# Patient Record
Sex: Male | Born: 1945 | Race: White | Hispanic: No | Marital: Married | Smoking: Never smoker
Health system: Southern US, Community
[De-identification: ages and names within clinical notes are randomized; demographics above are authoritative.]

## PROBLEM LIST (undated history)

## (undated) DIAGNOSIS — E291 Testicular hypofunction: Secondary | ICD-10-CM

## (undated) DIAGNOSIS — N400 Enlarged prostate without lower urinary tract symptoms: Secondary | ICD-10-CM

## (undated) DIAGNOSIS — N401 Enlarged prostate with lower urinary tract symptoms: Secondary | ICD-10-CM

## (undated) DIAGNOSIS — I10 Essential (primary) hypertension: Secondary | ICD-10-CM

## (undated) DIAGNOSIS — E785 Hyperlipidemia, unspecified: Secondary | ICD-10-CM

## (undated) DIAGNOSIS — I499 Cardiac arrhythmia, unspecified: Secondary | ICD-10-CM

## (undated) DIAGNOSIS — G2581 Restless legs syndrome: Secondary | ICD-10-CM

## (undated) DIAGNOSIS — D473 Essential (hemorrhagic) thrombocythemia: Secondary | ICD-10-CM

## (undated) DIAGNOSIS — I639 Cerebral infarction, unspecified: Secondary | ICD-10-CM

## (undated) DIAGNOSIS — K219 Gastro-esophageal reflux disease without esophagitis: Secondary | ICD-10-CM

## (undated) DIAGNOSIS — F329 Major depressive disorder, single episode, unspecified: Secondary | ICD-10-CM

## (undated) DIAGNOSIS — Z974 Presence of external hearing-aid: Secondary | ICD-10-CM

## (undated) DIAGNOSIS — F32A Depression, unspecified: Secondary | ICD-10-CM

## (undated) DIAGNOSIS — H9190 Unspecified hearing loss, unspecified ear: Secondary | ICD-10-CM

## (undated) DIAGNOSIS — H269 Unspecified cataract: Secondary | ICD-10-CM

## (undated) DIAGNOSIS — I82409 Acute embolism and thrombosis of unspecified deep veins of unspecified lower extremity: Secondary | ICD-10-CM

## (undated) DIAGNOSIS — F419 Anxiety disorder, unspecified: Secondary | ICD-10-CM

---
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
## 2003-02-04 DIAGNOSIS — I82409 Acute embolism and thrombosis of unspecified deep veins of unspecified lower extremity: Secondary | ICD-10-CM

## 2003-02-04 HISTORY — DX: Acute embolism and thrombosis of unspecified deep veins of unspecified lower extremity: I82.409

## 2007-11-17 ENCOUNTER — Emergency Department (HOSPITAL_BASED_OUTPATIENT_CLINIC_OR_DEPARTMENT_OTHER): Admission: EM | Admit: 2007-11-17 | Discharge: 2007-11-17 | Payer: Self-pay | Admitting: Emergency Medicine

## 2009-10-11 IMAGING — CR DG FINGER MIDDLE 2+V*L*
3 series · 3 of 3 positions shown · non-contrast
Comparison: None

CLINICAL DATA: The cut middle finger

LEFT MIDDLE FINGER 2+V

[x finger pa left]
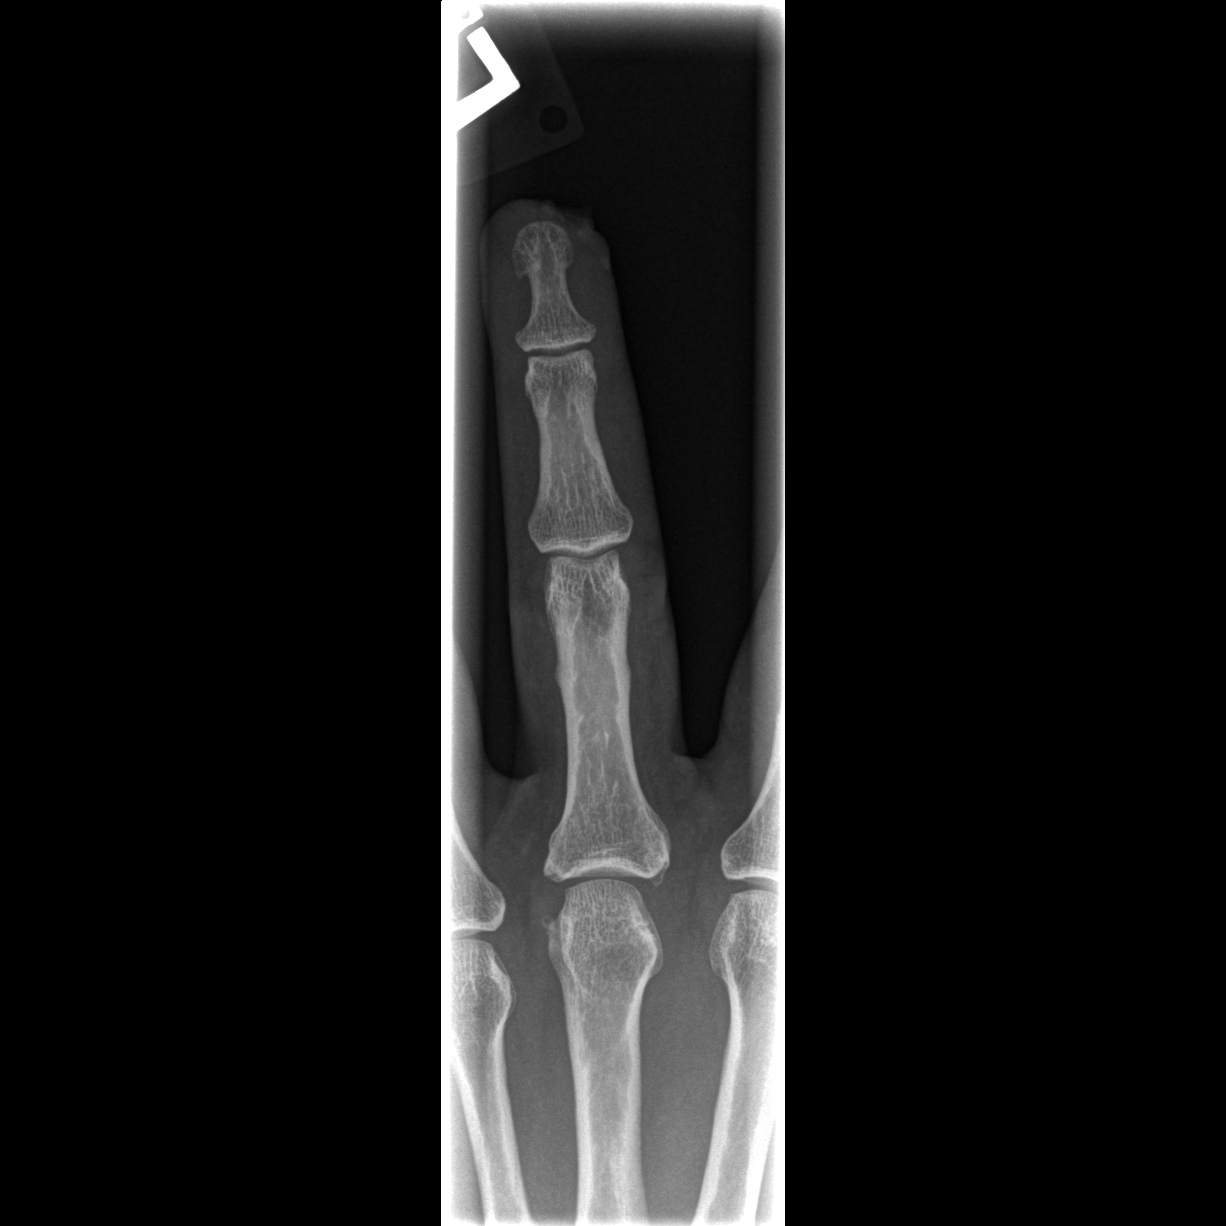

[x finger obl. left]
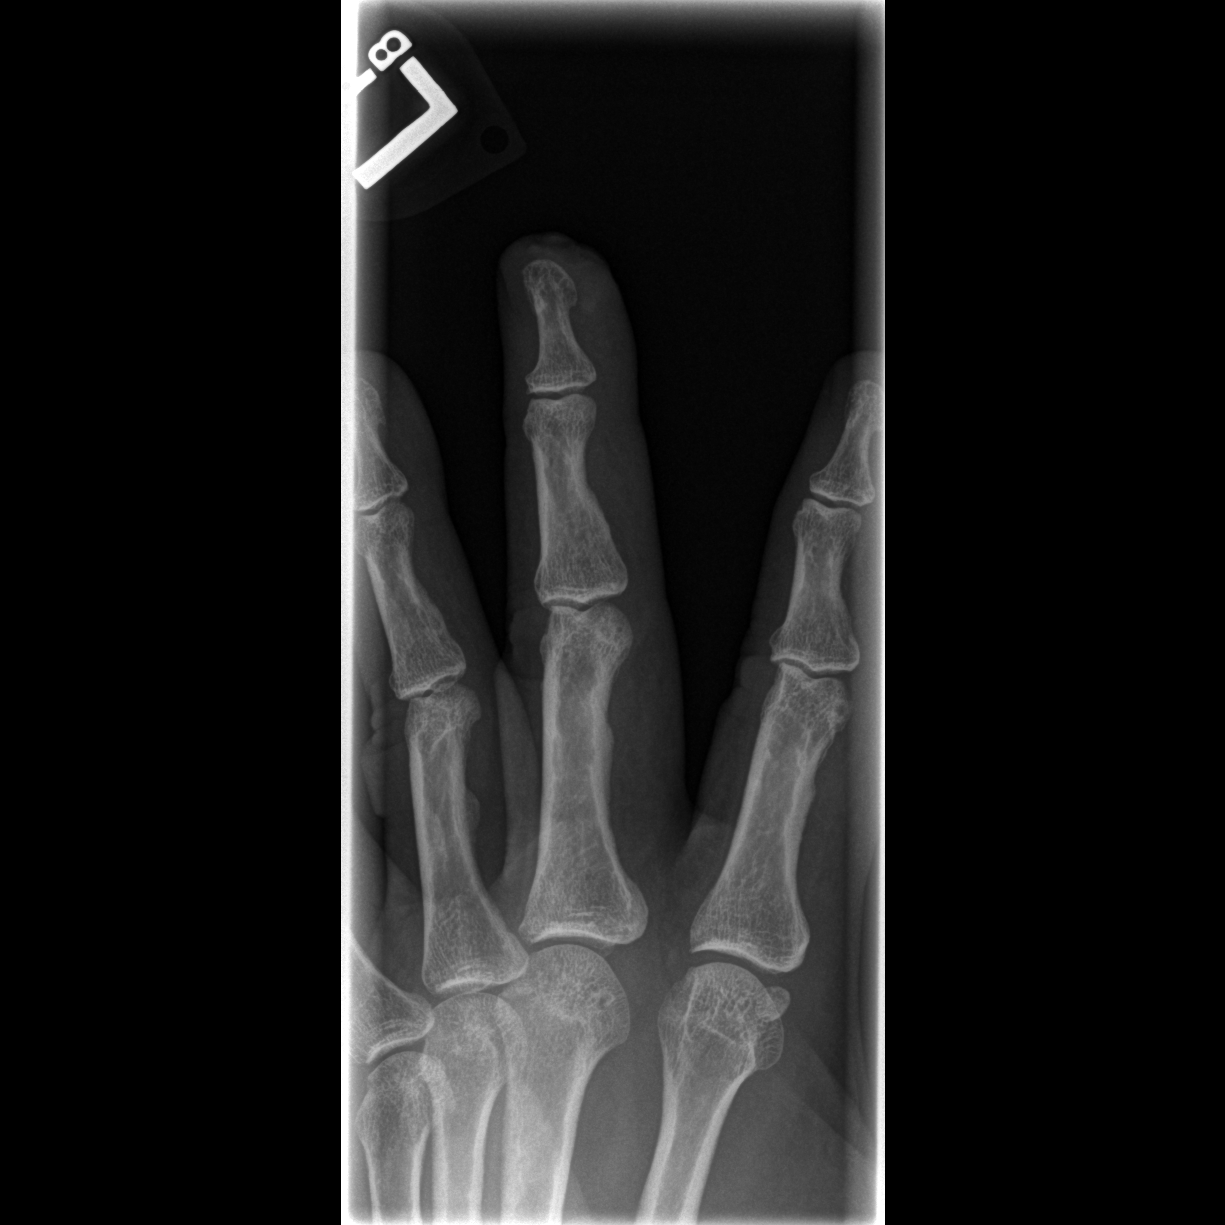

[x finger lateral left]
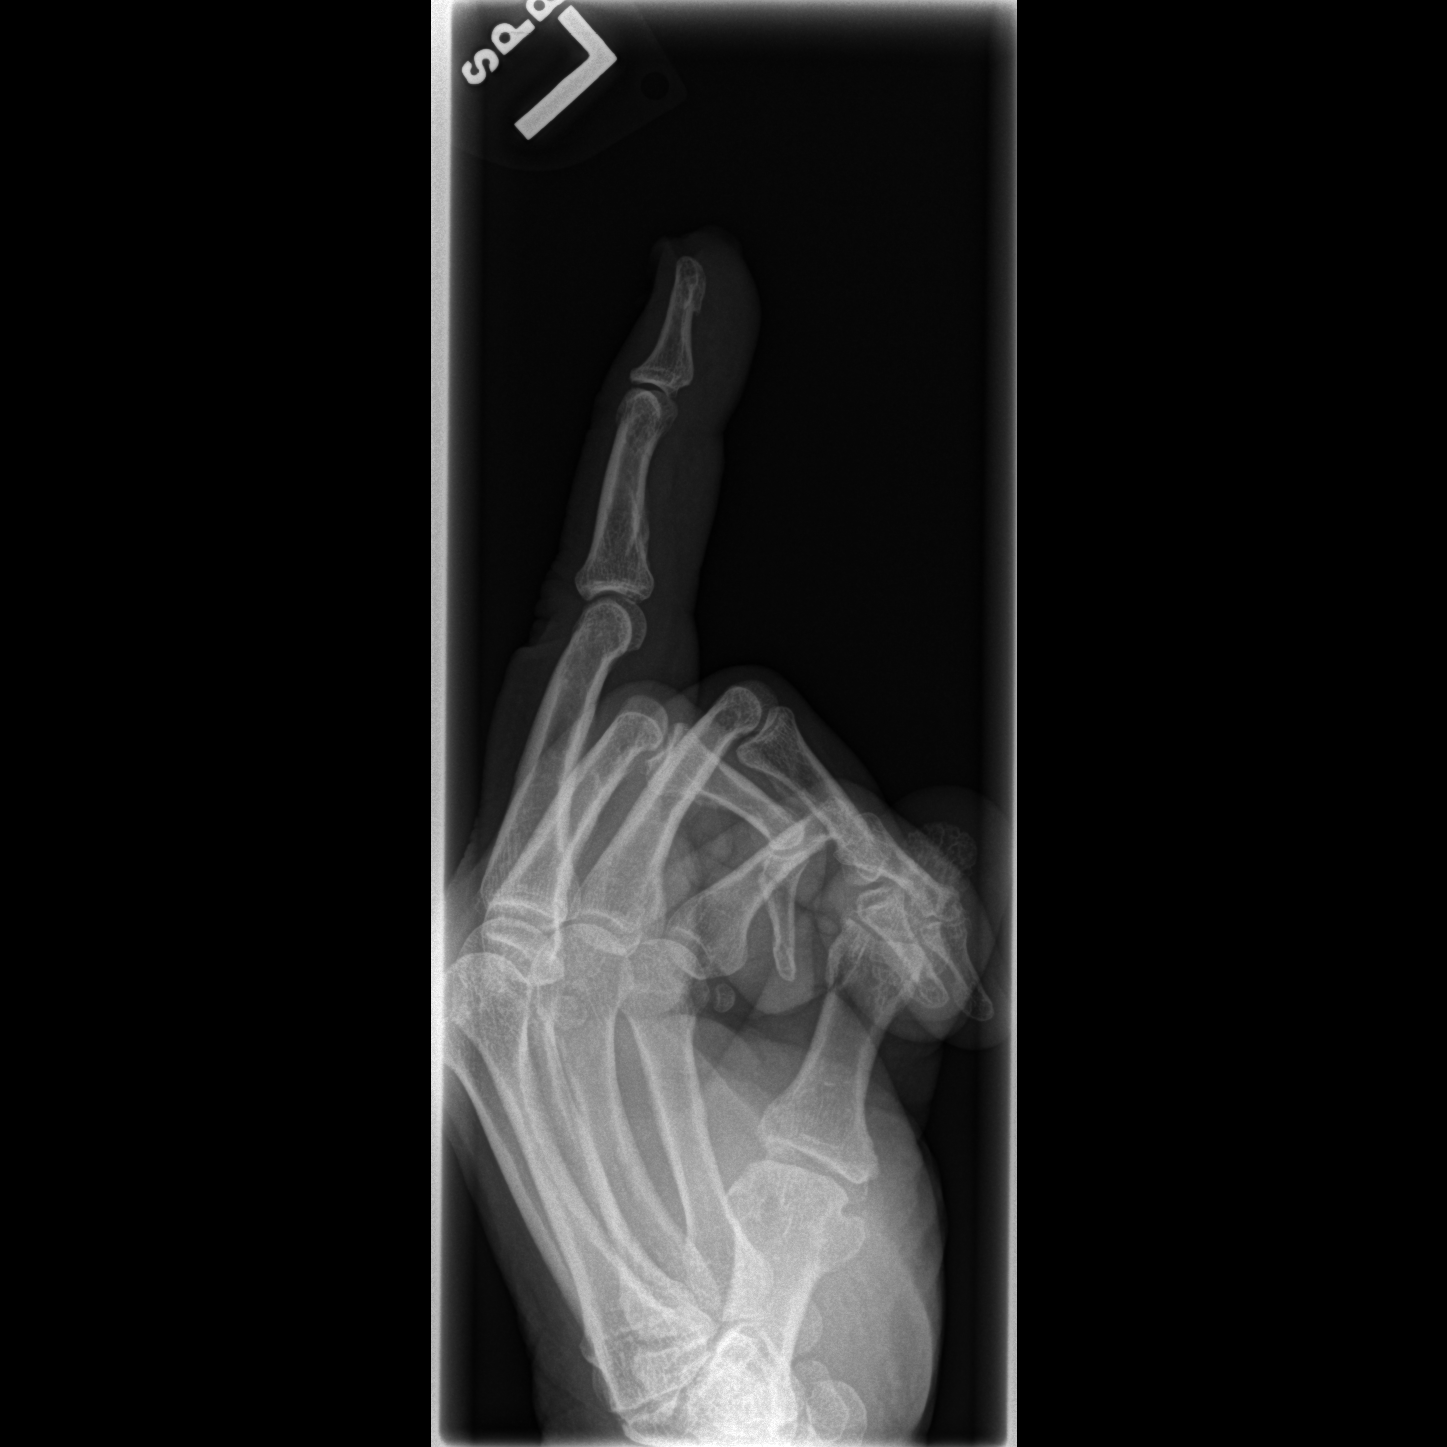

[3 of 3 positions shown; findings below may reference images not displayed]

FINDINGS: There is a laceration involving the soft tissues
overlying the tuft of the distal phalanx.

There is no underlying radiopaque foreign bodies.

The osseous structures are intact without fracture or dislocation.
IMPRESSION: 1.  Soft tissue laceration without underlying osseous abnormality.

## 2017-02-03 DIAGNOSIS — D75839 Thrombocytosis, unspecified: Secondary | ICD-10-CM

## 2017-02-03 HISTORY — DX: Thrombocytosis, unspecified: D75.839

## 2017-09-28 DIAGNOSIS — I639 Cerebral infarction, unspecified: Secondary | ICD-10-CM

## 2017-09-28 HISTORY — DX: Cerebral infarction, unspecified: I63.9

## 2018-07-05 ENCOUNTER — Encounter: Payer: Self-pay | Admitting: Student

## 2018-07-05 NOTE — H&P (Signed)
TOTAL HIP ADMISSION H&P  Patient is admitted for right total hip arthroplasty.  Subjective:  Chief Complaint: right hip pain  HPI: Bradley Ayers, 73 y.o. male, has a history of pain and functional disability in the right hip(s) due to arthritis and patient has failed non-surgical conservative treatments for greater than 12 weeks to include use of assistive devices and activity modification.  Onset of symptoms was gradual starting 4 years ago with gradually worsening course since that time.The patient noted no past surgery on the right hip(s).  Patient currently rates pain in the right hip at 6 out of 10 with activity. Patient has worsening of pain with activity and weight bearing, pain that interfers with activities of daily living and instability. 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 This condition presents safety issues increasing the risk of falls. There is no current active infection.  There are no active problems to display for this patient.  History reviewed. No pertinent past medical history.  History reviewed. No pertinent surgical history.  No current facility-administered medications for this encounter.    No current outpatient medications on file.   Allergies not on file  Social History   Tobacco Use   Smoking status: Not on file  Substance Use Topics   Alcohol use: Not on file    History reviewed. No pertinent family history.   Review of Systems  Constitutional: Negative for chills and fever.  HENT: Negative for congestion, sore throat and tinnitus.   Eyes: Negative for double vision, photophobia and pain.  Respiratory: Negative for cough, shortness of breath and wheezing.   Cardiovascular: Negative for chest pain, palpitations and orthopnea.  Gastrointestinal: Negative for heartburn, nausea and vomiting.  Genitourinary:  Negative for dysuria, frequency and urgency.  Musculoskeletal: Positive for joint pain.  Neurological: Negative for dizziness, weakness and headaches.    Objective:  Physical Exam  Well nourished and well developed.  General: Alert and oriented x3, cooperative and pleasant, no acute distress.  Head: normocephalic, atraumatic, neck supple.  Eyes: EOMI.  Respiratory: breath sounds clear in all fields, no wheezing, rales, or rhonchi. Cardiovascular: Regular rate and rhythm, no murmurs, gallops or rubs.  Abdomen: non-tender to palpation and soft, normoactive bowel sounds. Musculoskeletal:  Right Hip Exam: ROM: Flexion to 90 degrees. 30 degree external rotation contracture, no internal or external rotation beyond that was possible. Abduction 10 degrees.  There is no tenderness over the greater trochanter bursa.   Calves soft and nontender. Motor function intact in LE. Strength 5/5 LE bilaterally. Neuro: Distal pulses 2+. Sensation to light touch intact in LE.  Vital signs in last 24 hours: Blood pressure: 130/72 mmHg Pulse: 76 bpm  Labs:   There is no height or weight on file to calculate BMI.   Imaging Review Plain radiographs demonstrate severe degenerative joint disease of the right hip(s). The bone quality appears to be adequate for age and reported activity level.      Assessment/Plan:  End stage arthritis, right hip(s)  The patient history, physical examination, clinical judgement of the provider and imaging studies are consistent with end stage degenerative joint disease of the right hip(s) and total hip arthroplasty is deemed medically necessary. The treatment options including medical management, injection therapy, arthroscopy and arthroplasty were discussed at length. The risks and benefits of total hip arthroplasty were presented and reviewed. The risks due to aseptic loosening,  infection, stiffness, dislocation/subluxation,  thromboembolic complications and other  imponderables were discussed.  The patient acknowledged the explanation, agreed to proceed with the plan and consent was signed. Patient is being admitted for inpatient treatment for surgery, pain control, PT, OT, prophylactic antibiotics, VTE prophylaxis, progressive ambulation and ADL's and discharge planning.The patient is planning to be discharged home.  Anticipated LOS equal to or greater than 2 midnights due to - Age 32 and older with one or more of the following:  - Obesity  - Expected need for hospital services (PT, OT, Nursing) required for safe  discharge  - Anticipated need for postoperative skilled nursing care or inpatient rehab  - Active co-morbidities: Stroke and DVT/VTE OR   - Unanticipated findings during/Post Surgery: None  - Patient is a high risk of re-admission due to: None   Therapy Plans: HEP Disposition: Home with wife Planned DVT Prophylaxis: Plavix and aspirin (hx of stroke) DME needed: Gilford Rile, 3-in-1 PCP: Dr. Jeralene Huff Hematologist: Dr. Stacie Glaze TXA: Topical (hx DVT/PE) Allergies: NKDA Anesthesia Concerns: None BMI: 31.4  - Patient was instructed on what medications to stop prior to surgery. - Follow-up visit in 2 weeks with Dr. Wynelle Link - Begin physical therapy following surgery - Pre-operative lab work as pre-surgical testing - Prescriptions will be provided in hospital at time of discharge  Theresa Duty, PA-C Orthopedic Surgery [facility] [place]

## 2018-07-20 NOTE — Patient Instructions (Addendum)
YOU ARE REQUIRED TO BE TESTED FOR COVID-19 PRIOR TO YOUR SURGERY . YOUR TEST MUST BE COMPLETED ON Thursday June 18th  . TESTING IS LOCATED AT [place] ENTRANCE FROM 9:00AM - 3:00PM. FAILURE TO COMPLETE TESTING MAY RESULT IN CANCELLATION OF YOUR SURGERY.  ONCE YOUR COVID TEST IS COMPLETED, PLEASE BEGIN THE QUARANTINE INSTRUCTIONS AS OUTLINED IN YOUR HANDOUT.                 Aarin Stansel     Your procedure is scheduled on: 07-26-2018    Report to [facility] Main  Entrance    Report to admitting at 1210 PM      Call this number if you have problems the morning of surgery [phone]    Remember:  [place], NO [place].   NO SOLID FOOD AFTER MIDNIGHT THE NIGHT PRIOR TO SURGERY. YOU MAY ONLY DRINK CLEAR LIQUIDS UNTIL 830 AM.  PLEASE   FINISH ENSURE DRINK PER SURGEON ORDER WHICH NEEDS TO BE COMPLETED AT 430 AM.    CLEAR LIQUID DIET   Foods Allowed                                                                     Foods Excluded  Coffee and tea, regular and decaf                             liquids that you cannot  Plain Jell-O in any flavor                                             see through such as: Fruit ices (not with fruit pulp)                                     milk, soups, orange juice  Iced Popsicles                                    All solid food Carbonated beverages, regular and diet                                    Cranberry, grape and apple juices Sports drinks like Gatorade Lightly seasoned clear broth or consume(fat free) Sugar, honey syrup  Sample Menu Breakfast                                Lunch                                     Supper Cranberry juice  Beef broth                            Chicken broth Jell-O                                     Grape juice                           Apple juice Coffee or tea                         Jell-O                                      Popsicle                                                Coffee or tea                        Coffee or tea  _____________________________________________________________________    Take these medicines the morning of surgery with A SIP OF WATER: DULOXETINE (CYMBALTA), ESCITALOPRAM (LEXAPRO), SIMVASTATIN (LIPITOR), FINASTERIDE (PROSCAR), ALFUZOSIN (UROXATRAL), PANTAPRAZOLE (PROTONIX)                                You may not have any metal on your body including hair pins and              piercings  Do not wear jewelry, make-up, lotions, powders or perfumes, deodorant              Men may shave face and neck.   Do not bring valuables to the hospital. [place].  Contacts, dentures or bridgework may not be worn into surgery.  Leave suitcase in the car. After surgery it may be brought to your room.      _____________________________________________________________________             [facility] - Preparing for Surgery Before surgery, you can play an important role.  Because skin is not sterile, your skin needs to be as free of germs as possible.  You can reduce the number of germs on your skin by washing with CHG (chlorahexidine gluconate) soap before surgery.  CHG is an antiseptic cleaner which kills germs and bonds with the skin to continue killing germs even after washing. Please DO NOT use if you have an allergy to CHG or antibacterial soaps.  If your skin becomes reddened/irritated stop using the CHG and inform your nurse when you arrive at Short Stay. Do not shave (including legs and underarms) for at least 48 hours prior to the first CHG shower.  You may shave your face/neck. Please follow these instructions carefully:  1.  Shower with CHG Soap the night before surgery and the  morning of Surgery.  2.  If you choose to wash your hair, wash your hair first as usual with  your   normal  shampoo.  3.  After you shampoo, rinse your hair and body thoroughly to remove the  shampoo.                           4.  Use CHG as you would any other liquid soap.  You can apply chg directly  to the skin and wash                       Gently with a scrungie or clean washcloth.  5.  Apply the CHG Soap to your body ONLY FROM THE NECK DOWN.   Do not use on face/ open                           Wound or open sores. Avoid contact with eyes, ears mouth and genitals (private parts).                       Wash face,  Genitals (private parts) with your normal soap.             6.  Wash thoroughly, paying special attention to the area where your surgery  will be performed.  7.  Thoroughly rinse your body with warm water from the neck down.  8.  DO NOT shower/wash with your normal soap after using and rinsing off  the CHG Soap.                9.  Pat yourself dry with a clean towel.            10.  Wear clean pajamas.            11.  Place clean sheets on your bed the night of your first shower and do not  sleep with pets. Day of Surgery : Do not apply any lotions/deodorants the morning of surgery.  Please wear clean clothes to the hospital/surgery center.  FAILURE TO FOLLOW THESE INSTRUCTIONS MAY RESULT IN THE CANCELLATION OF YOUR SURGERY PATIENT SIGNATURE_________________________________  NURSE SIGNATURE__________________________________  ________________________________________________________________________   [name]  An incentive spirometer is a tool that can help keep your lungs clear and active. This tool measures how well you are filling your lungs with each breath. Taking long deep breaths may help reverse or decrease the chance of developing breathing (pulmonary) problems (especially infection) following:  A long period of time when you are unable to move or be active. BEFORE THE PROCEDURE   If the spirometer includes an indicator to show your best effort, your  nurse or respiratory therapist will set it to a desired goal.  If possible, sit up straight or lean slightly forward. Try not to slouch.  Hold the incentive spirometer in an upright position. INSTRUCTIONS FOR USE  1. Sit on the edge of your bed if possible, or sit up as far as you can in bed or on a chair. 2. Hold the incentive spirometer in an upright position. 3. Breathe out normally. 4. Place the mouthpiece in your mouth and seal your lips tightly around it. 5. Breathe in slowly and as deeply as possible, raising the piston or the ball toward the top of the column. 6. Hold your breath for 3-5 seconds or for as long as possible. Allow the piston or ball to fall to the bottom of the column. 7. Remove the mouthpiece from your  mouth and breathe out normally. 8. Rest for a few seconds and repeat Steps 1 through 7 at least 10 times every 1-2 hours when you are awake. Take your time and take a few normal breaths between deep breaths. 9. The spirometer may include an indicator to show your best effort. Use the indicator as a goal to work toward during each repetition. 10. After each set of 10 deep breaths, practice coughing to be sure your lungs are clear. If you have an incision (the cut made at the time of surgery), support your incision when coughing by placing a pillow or rolled up towels firmly against it. Once you are able to get out of bed, walk around indoors and cough well. You may stop using the incentive spirometer when instructed by your caregiver.  RISKS AND COMPLICATIONS  Take your time so you do not get dizzy or light-headed.  If you are in pain, you may need to take or ask for pain medication before doing incentive spirometry. It is harder to take a deep breath if you are having pain. AFTER USE  Rest and breathe slowly and easily.  It can be helpful to keep track of a log of your progress. Your caregiver can provide you with a simple table to help with this. If you are using the  spirometer at home, follow these instructions: [place] IF:   You are having difficultly using the spirometer.  You have trouble using the spirometer as often as instructed.  Your pain medication is not giving enough relief while using the spirometer.  You develop fever of 100.5 F (38.1 C) or higher. SEEK IMMEDIATE MEDICAL CARE IF:   You cough up bloody sputum that had not been present before.  You develop fever of 102 F (38.9 C) or greater.  You develop worsening pain at or near the incision site. MAKE SURE YOU:   Understand these instructions.  Will watch your condition.  Will get help right away if you are not doing well or get worse. Document Released: 06/02/2006 Document Revised: 04/14/2011 Document Reviewed: 08/03/2006 ExitCare Patient Information 2014 ExitCare, [place].   ________________________________________________________________________  WHAT IS A BLOOD TRANSFUSION? Blood Transfusion Information  A transfusion is the replacement of blood or some of its parts. Blood is made up of multiple cells which provide different functions.  Red blood cells carry oxygen and are used for blood loss replacement.  White blood cells fight against infection.  Platelets control bleeding.  Plasma helps clot blood.  Other blood products are available for specialized needs, such as hemophilia or other clotting disorders. BEFORE THE TRANSFUSION  Who gives blood for transfusions?   Healthy volunteers who are fully evaluated to make sure their blood is safe. This is blood bank blood. Transfusion therapy is the safest it has ever been in the practice of medicine. Before blood is taken from a donor, a complete history is taken to make sure that person has no history of diseases nor engages in risky social behavior (examples are intravenous drug use or sexual activity with multiple partners). The donor's travel history is screened to minimize risk of transmitting  infections, such as malaria. The donated blood is tested for signs of infectious diseases, such as HIV and hepatitis. The blood is then tested to be sure it is compatible with you in order to minimize the chance of a transfusion reaction. If you or a relative donates blood, this is often done in anticipation of surgery and is not  appropriate for emergency situations. It takes many days to process the donated blood. RISKS AND COMPLICATIONS Although transfusion therapy is very safe and saves many lives, the main dangers of transfusion include:   Getting an infectious disease.  Developing a transfusion reaction. This is an allergic reaction to something in the blood you were given. Every precaution is taken to prevent this. The decision to have a blood transfusion has been considered carefully by your caregiver before blood is given. Blood is not given unless the benefits outweigh the risks. AFTER THE TRANSFUSION  Right after receiving a blood transfusion, you will usually feel much better and more energetic. This is especially true if your red blood cells have gotten low (anemic). The transfusion raises the level of the red blood cells which carry oxygen, and this usually causes an energy increase.  The nurse administering the transfusion will monitor you carefully for complications. HOME CARE INSTRUCTIONS  No special instructions are needed after a transfusion. You may find your energy is better. Speak with your caregiver about any limitations on activity for underlying diseases you may have. SEEK MEDICAL CARE IF:   Your condition is not improving after your transfusion.  You develop redness or irritation at the intravenous (IV) site. SEEK IMMEDIATE MEDICAL CARE IF:  Any of the following symptoms occur over the next 12 hours:  Shaking chills.  You have a temperature by mouth above 102 F (38.9 C), not controlled by medicine.  Chest, back, or muscle pain.  People around you feel you are  not acting correctly or are confused.  Shortness of breath or difficulty breathing.  Dizziness and fainting.  You get a rash or develop hives.  You have a decrease in urine output.  Your urine turns a dark color or changes to pink, red, or brown. Any of the following symptoms occur over the next 10 days:  You have a temperature by mouth above 102 F (38.9 C), not controlled by medicine.  Shortness of breath.  Weakness after normal activity.  The white part of the eye turns yellow (jaundice).  You have a decrease in the amount of urine or are urinating less often.  Your urine turns a dark color or changes to pink, red, or brown. Document Released: 01/18/2000 Document Revised: 04/14/2011 Document Reviewed: 09/06/2007 [facility] Patient Information 2014 [place], [place].  _______________________________________________________________________

## 2018-07-21 ENCOUNTER — Encounter (HOSPITAL_COMMUNITY)
Admission: RE | Admit: 2018-07-21 | Discharge: 2018-07-21 | Disposition: A | Discharge status: Home / Self Care | Payer: Medicare Other | Source: Ambulatory Visit | Attending: Orthopedic Surgery | Admitting: Orthopedic Surgery

## 2018-07-21 ENCOUNTER — Encounter (HOSPITAL_COMMUNITY): Payer: Self-pay

## 2018-07-21 ENCOUNTER — Other Ambulatory Visit: Payer: Self-pay

## 2018-07-21 DIAGNOSIS — M1611 Unilateral primary osteoarthritis, right hip: Secondary | ICD-10-CM | POA: Diagnosis not present

## 2018-07-21 DIAGNOSIS — I10 Essential (primary) hypertension: Secondary | ICD-10-CM | POA: Diagnosis not present

## 2018-07-21 DIAGNOSIS — Z01818 Encounter for other preprocedural examination: Secondary | ICD-10-CM | POA: Insufficient documentation

## 2018-07-21 HISTORY — DX: Unspecified hearing loss, unspecified ear: H91.90

## 2018-07-21 HISTORY — DX: Depression, unspecified: F32.A

## 2018-07-21 HISTORY — DX: Benign prostatic hyperplasia with lower urinary tract symptoms: N40.1

## 2018-07-21 HISTORY — DX: Essential (primary) hypertension: I10

## 2018-07-21 HISTORY — DX: Cardiac arrhythmia, unspecified: I49.9

## 2018-07-21 HISTORY — DX: Gastro-esophageal reflux disease without esophagitis: K21.9

## 2018-07-21 HISTORY — DX: Restless legs syndrome: G25.81

## 2018-07-21 HISTORY — DX: Benign prostatic hyperplasia without lower urinary tract symptoms: N40.0

## 2018-07-21 HISTORY — DX: Unspecified cataract: H26.9

## 2018-07-21 HISTORY — DX: Testicular hypofunction: E29.1

## 2018-07-21 HISTORY — DX: Presence of external hearing-aid: Z97.4

## 2018-07-21 HISTORY — DX: Hyperlipidemia, unspecified: E78.5

## 2018-07-21 HISTORY — DX: Anxiety disorder, unspecified: F41.9

## 2018-07-21 LAB — ABO/RH: ABO/RH(D): A POS

## 2018-07-21 LAB — COMPREHENSIVE METABOLIC PANEL
ALT: 17 U/L (ref 0–44)
AST: 21 U/L (ref 15–41)
Albumin: 4.2 g/dL (ref 3.5–5.0)
Alkaline Phosphatase: 64 U/L (ref 38–126)
Anion gap: 8 (ref 5–15)
BUN: 18 mg/dL (ref 8–23)
CO2: 27 mmol/L (ref 22–32)
Calcium: 9 mg/dL (ref 8.9–10.3)
Chloride: 106 mmol/L (ref 98–111)
Creatinine, Ser: 0.81 mg/dL (ref 0.61–1.24)
GFR calc Af Amer: >60 mL/min (ref >60)
GFR calc non Af Amer: >60 mL/min (ref >60)
Glucose, Bld: 98 mg/dL (ref 70–99)
Potassium: 4.7 mmol/L (ref 3.5–5.1)
Sodium: 141 mmol/L (ref 135–145)
Total Bilirubin: 0.8 mg/dL (ref 0.3–1.2)
Total Protein: 7.2 g/dL (ref 6.5–8.1)

## 2018-07-21 LAB — CBC
HCT: 39.5 % (ref 39.0–52.0)
Hemoglobin: 13 g/dL (ref 13.0–17.0)
MCH: 39.2 pg — ABNORMAL HIGH (ref 26.0–34.0)
MCHC: 32.9 g/dL (ref 30.0–36.0)
MCV: 119 fL — ABNORMAL HIGH (ref 80.0–100.0)
Platelets: 402 10*3/uL — ABNORMAL HIGH (ref 150–400)
RBC: 3.32 MIL/uL — ABNORMAL LOW (ref 4.22–5.81)
RDW: 12.9 % (ref 11.5–15.5)
WBC: 5.8 10*3/uL (ref 4.0–10.5)
nRBC: 0 % (ref 0.0–0.2)

## 2018-07-21 LAB — PROTIME-INR
INR: 1 (ref 0.8–1.2)
Prothrombin Time: 13 seconds (ref 11.4–15.2)

## 2018-07-21 LAB — SURGICAL PCR SCREEN
MRSA, PCR: NEGATIVE
Staphylococcus aureus: NEGATIVE

## 2018-07-21 LAB — APTT: aPTT: 28 seconds (ref 24–36)

## 2018-07-21 NOTE — Progress Notes (Signed)
PRE-O NURSE PROGRESS NOTE   Patient with hx of CVA 09-28-17. pertinent hx of htn , hld, and thrombocythemia. Imaging done at [facility] at time of stroke  includes MRI head, CT angiography, ECHO (see in epic care everywhere). Patient reports residual impaired balance and loss of sensation in left side of face. Patient denies prior stroke nor  MI and denies recurring stroke sx since. Vitals today WDL and no complaint of acute cardiac sx. Patient managed by neurologist Dr Delman Kitten with [facility] and thrombocythemia is managed by Dr Cruzita Lederer (hematology oncology) with [facility]. Patient reports Dr Cruzita Lederer manages his PLAVIX prescription but he has not received any instructions on PLAVIX management for upcoming surgery. Also note that patient states his neurologist is not aware of his upcoming surgery .

## 2018-07-22 ENCOUNTER — Other Ambulatory Visit (HOSPITAL_COMMUNITY)
Admission: RE | Admit: 2018-07-22 | Discharge: 2018-07-22 | Disposition: A | Discharge status: Home / Self Care | Payer: Medicare Other | Source: Ambulatory Visit | Attending: Orthopedic Surgery | Admitting: Orthopedic Surgery

## 2018-07-22 DIAGNOSIS — Z01818 Encounter for other preprocedural examination: Secondary | ICD-10-CM | POA: Diagnosis not present

## 2018-07-22 LAB — SARS CORONAVIRUS 2 (TAT 6-24 HRS): SARS Coronavirus 2: NEGATIVE
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
## 2018-07-23 NOTE — Anesthesia Preprocedure Evaluation (Addendum)
Anesthesia Evaluation  Patient identified by MRN, date of birth, ID band Patient awake    Reviewed: Allergy & Precautions, NPO status , Patient's Chart, lab work & pertinent test results  Airway Mallampati: II  TM Distance: >3 FB Neck ROM: Full    Dental  (+) Chipped,    Pulmonary neg pulmonary ROS,    Pulmonary exam normal breath sounds clear to auscultation       Cardiovascular hypertension, Pt. on medications + DVT  Normal cardiovascular exam Rhythm:Regular Rate:Normal  ECG: NSR rate 66   Neuro/Psych PSYCHIATRIC DISORDERS Anxiety Depression RLS (restless legs syndrome) CVA, No Residual Symptoms    GI/Hepatic Neg liver ROS, GERD  Medicated and Controlled,  Endo/Other  negative endocrine ROS  Renal/GU negative Renal ROS     Musculoskeletal negative musculoskeletal ROS (+)   Abdominal (+) + obese,   Peds  Hematology HLD   Anesthesia Other Findings Right hip osteoarthritis  Reproductive/Obstetrics                          Anesthesia Physical Anesthesia Plan  ASA: III  Anesthesia Plan: Spinal   Post-op Pain Management:    Induction:   PONV Risk Score and Plan: 1 and Ondansetron, Dexamethasone and Treatment may vary due to age or medical condition  Airway Management Planned: Simple Face Mask  Additional Equipment:   Intra-op Plan:   Post-operative Plan:   Informed Consent: I have reviewed the patients History and Physical, chart, labs and discussed the procedure including the risks, benefits and alternatives for the proposed anesthesia with the patient or authorized representative who has indicated his/her understanding and acceptance.     Dental advisory given  Plan Discussed with: CRNA  Anesthesia Plan Comments: (Reviewed PAT note 07/21/2018, Konrad Felix, PA-C)      Anesthesia Quick Evaluation

## 2018-07-26 ENCOUNTER — Inpatient Hospital Stay (HOSPITAL_COMMUNITY): Payer: Medicare Other

## 2018-07-26 ENCOUNTER — Encounter (HOSPITAL_COMMUNITY)
Admission: RE | Disposition: A | Discharge status: Home / Self Care | Payer: Self-pay | Source: Home / Self Care | Attending: Orthopedic Surgery

## 2018-07-26 ENCOUNTER — Other Ambulatory Visit: Payer: Self-pay

## 2018-07-26 ENCOUNTER — Inpatient Hospital Stay (HOSPITAL_COMMUNITY)
Admission: RE | Admit: 2018-07-26 | Discharge: 2018-07-28 | DRG: 470 | Disposition: A | Discharge status: Home / Self Care | Payer: Medicare Other | Attending: Orthopedic Surgery | Admitting: Orthopedic Surgery

## 2018-07-26 ENCOUNTER — Encounter (HOSPITAL_COMMUNITY): Payer: Self-pay

## 2018-07-26 ENCOUNTER — Inpatient Hospital Stay (HOSPITAL_COMMUNITY): Payer: Medicare Other | Admitting: Certified Registered Nurse Anesthetist

## 2018-07-26 ENCOUNTER — Inpatient Hospital Stay (HOSPITAL_COMMUNITY): Payer: Medicare Other | Admitting: Physician Assistant

## 2018-07-26 DIAGNOSIS — Z96649 Presence of unspecified artificial hip joint: Secondary | ICD-10-CM

## 2018-07-26 DIAGNOSIS — Z8673 Personal history of transient ischemic attack (TIA), and cerebral infarction without residual deficits: Secondary | ICD-10-CM

## 2018-07-26 DIAGNOSIS — E669 Obesity, unspecified: Secondary | ICD-10-CM | POA: Diagnosis present

## 2018-07-26 DIAGNOSIS — E785 Hyperlipidemia, unspecified: Secondary | ICD-10-CM | POA: Diagnosis present

## 2018-07-26 DIAGNOSIS — Z419 Encounter for procedure for purposes other than remedying health state, unspecified: Secondary | ICD-10-CM

## 2018-07-26 DIAGNOSIS — M169 Osteoarthritis of hip, unspecified: Secondary | ICD-10-CM

## 2018-07-26 DIAGNOSIS — M1611 Unilateral primary osteoarthritis, right hip: Principal | ICD-10-CM | POA: Diagnosis present

## 2018-07-26 DIAGNOSIS — G2581 Restless legs syndrome: Secondary | ICD-10-CM | POA: Diagnosis present

## 2018-07-26 DIAGNOSIS — Z6831 Body mass index (BMI) 31.0-31.9, adult: Secondary | ICD-10-CM

## 2018-07-26 DIAGNOSIS — M25751 Osteophyte, right hip: Secondary | ICD-10-CM | POA: Diagnosis present

## 2018-07-26 DIAGNOSIS — I10 Essential (primary) hypertension: Secondary | ICD-10-CM | POA: Diagnosis present

## 2018-07-26 DIAGNOSIS — Z1159 Encounter for screening for other viral diseases: Secondary | ICD-10-CM

## 2018-07-26 DIAGNOSIS — Z86718 Personal history of other venous thrombosis and embolism: Secondary | ICD-10-CM

## 2018-07-26 DIAGNOSIS — K219 Gastro-esophageal reflux disease without esophagitis: Secondary | ICD-10-CM | POA: Diagnosis present

## 2018-07-26 HISTORY — PX: TOTAL HIP ARTHROPLASTY: SHX124

## 2018-07-26 LAB — TYPE AND SCREEN
ABO/RH(D): A POS
Antibody Screen: NEGATIVE

## 2018-07-26 SURGERY — ARTHROPLASTY, HIP, TOTAL, ANTERIOR APPROACH
Anesthesia: Spinal | Laterality: Right

## 2018-07-26 MED ORDER — CHLORHEXIDINE GLUCONATE 4 % EX LIQD: 60.0000 mL | Freq: Once | CUTANEOUS | Status: DC

## 2018-07-26 MED ORDER — MENTHOL 3 MG MT LOZG: 1.0000 | LOZENGE | OROMUCOSAL | Status: DC | PRN

## 2018-07-26 MED ORDER — METOCLOPRAMIDE HCL 5 MG/ML IJ SOLN: 5.0000 mg | Freq: Three times a day (TID) | INTRAMUSCULAR | Status: DC | PRN

## 2018-07-26 MED ORDER — ACETAMINOPHEN 10 MG/ML IV SOLN
1000.0000 mg | Freq: Four times a day (QID) | INTRAVENOUS | Status: DC
  Administered 2018-07-26: 1000 mg via INTRAVENOUS
  Filled 2018-07-26: qty 100

## 2018-07-26 MED ORDER — SODIUM CHLORIDE 0.9 % IV SOLN
INTRAVENOUS | Status: DC | PRN
  Administered 2018-07-26: 50 ug/min via INTRAVENOUS

## 2018-07-26 MED ORDER — PROPOFOL 10 MG/ML IV BOLUS
INTRAVENOUS | Status: AC
  Filled 2018-07-26: qty 60

## 2018-07-26 MED ORDER — DEXAMETHASONE SODIUM PHOSPHATE 4 MG/ML IJ SOLN
INTRAMUSCULAR | Status: DC | PRN
  Administered 2018-07-26: 8 mg via INTRAVENOUS

## 2018-07-26 MED ORDER — ESCITALOPRAM OXALATE 20 MG PO TABS
20.0000 mg | ORAL_TABLET | Freq: Every day | ORAL | Status: DC
  Administered 2018-07-27: 11:00:00 20 mg via ORAL
  Filled 2018-07-26: qty 1

## 2018-07-26 MED ORDER — METHOCARBAMOL 500 MG IVPB - SIMPLE MED
500.0000 mg | Freq: Four times a day (QID) | INTRAVENOUS | Status: DC | PRN
  Filled 2018-07-26: qty 50

## 2018-07-26 MED ORDER — PHENYLEPHRINE 40 MCG/ML (10ML) SYRINGE FOR IV PUSH (FOR BLOOD PRESSURE SUPPORT)
PREFILLED_SYRINGE | INTRAVENOUS | Status: DC | PRN
  Administered 2018-07-26: 80 ug via INTRAVENOUS

## 2018-07-26 MED ORDER — PROPOFOL 500 MG/50ML IV EMUL
INTRAVENOUS | Status: DC | PRN
  Administered 2018-07-26: 100 ug/kg/min via INTRAVENOUS

## 2018-07-26 MED ORDER — PANTOPRAZOLE SODIUM 20 MG PO TBEC
20.0000 mg | DELAYED_RELEASE_TABLET | Freq: Every day | ORAL | Status: DC
  Administered 2018-07-27: 20 mg via ORAL
  Filled 2018-07-26 (×2): qty 1

## 2018-07-26 MED ORDER — PHENYLEPHRINE 40 MCG/ML (10ML) SYRINGE FOR IV PUSH (FOR BLOOD PRESSURE SUPPORT)
PREFILLED_SYRINGE | INTRAVENOUS | Status: AC
  Filled 2018-07-26: qty 10

## 2018-07-26 MED ORDER — HYDROXYUREA 500 MG PO CAPS
500.0000 mg | ORAL_CAPSULE | Freq: Two times a day (BID) | ORAL | Status: DC
  Administered 2018-07-27 (×2): 500 mg via ORAL
  Filled 2018-07-26 (×3): qty 1

## 2018-07-26 MED ORDER — CLOPIDOGREL BISULFATE 75 MG PO TABS
75.0000 mg | ORAL_TABLET | Freq: Every day | ORAL | Status: DC
  Administered 2018-07-27: 75 mg via ORAL
  Filled 2018-07-26: qty 1

## 2018-07-26 MED ORDER — ONDANSETRON HCL 4 MG/2ML IJ SOLN
INTRAMUSCULAR | Status: DC | PRN
  Administered 2018-07-26: 4 mg via INTRAVENOUS

## 2018-07-26 MED ORDER — SODIUM CHLORIDE 0.9 % IV SOLN
INTRAVENOUS | Status: DC
  Administered 2018-07-26 – 2018-07-27 (×3): via INTRAVENOUS

## 2018-07-26 MED ORDER — FLUTICASONE PROPIONATE 50 MCG/ACT NA SUSP
2.0000 | Freq: Every day | NASAL | Status: DC
  Filled 2018-07-26: qty 16

## 2018-07-26 MED ORDER — BISACODYL 10 MG RE SUPP: 10.0000 mg | Freq: Every day | RECTAL | Status: DC | PRN

## 2018-07-26 MED ORDER — ALFUZOSIN HCL ER 10 MG PO TB24
10.0000 mg | ORAL_TABLET | Freq: Every day | ORAL | Status: DC
  Administered 2018-07-27 – 2018-07-28 (×2): 10 mg via ORAL
  Filled 2018-07-26 (×2): qty 1

## 2018-07-26 MED ORDER — DEXAMETHASONE SODIUM PHOSPHATE 10 MG/ML IJ SOLN: 8.0000 mg | Freq: Once | INTRAMUSCULAR | Status: DC

## 2018-07-26 MED ORDER — CEFAZOLIN SODIUM-DEXTROSE 2-4 GM/100ML-% IV SOLN
2.0000 g | Freq: Four times a day (QID) | INTRAVENOUS | Status: AC
  Administered 2018-07-26 – 2018-07-27 (×2): 2 g via INTRAVENOUS
  Filled 2018-07-26 (×2): qty 100

## 2018-07-26 MED ORDER — SIMVASTATIN 20 MG PO TABS
20.0000 mg | ORAL_TABLET | Freq: Every day | ORAL | Status: DC
  Filled 2018-07-26: qty 1

## 2018-07-26 MED ORDER — ROPINIROLE HCL 0.5 MG PO TABS
0.7500 mg | ORAL_TABLET | Freq: Every day | ORAL | Status: DC
  Administered 2018-07-26 – 2018-07-27 (×2): 0.75 mg via ORAL
  Filled 2018-07-26 (×2): qty 2

## 2018-07-26 MED ORDER — FINASTERIDE 5 MG PO TABS
5.0000 mg | ORAL_TABLET | Freq: Every day | ORAL | Status: DC
  Administered 2018-07-27: 11:00:00 5 mg via ORAL
  Filled 2018-07-26: qty 1

## 2018-07-26 MED ORDER — EPHEDRINE 5 MG/ML INJ
INTRAVENOUS | Status: AC
  Filled 2018-07-26: qty 10

## 2018-07-26 MED ORDER — PHENYLEPHRINE HCL (PRESSORS) 10 MG/ML IV SOLN
INTRAVENOUS | Status: AC
  Filled 2018-07-26: qty 1

## 2018-07-26 MED ORDER — FLEET ENEMA 7-19 GM/118ML RE ENEM: 1.0000 | ENEMA | Freq: Once | RECTAL | Status: DC | PRN

## 2018-07-26 MED ORDER — DEXAMETHASONE SODIUM PHOSPHATE 10 MG/ML IJ SOLN
INTRAMUSCULAR | Status: AC
  Filled 2018-07-26: qty 1

## 2018-07-26 MED ORDER — HYDROCODONE-ACETAMINOPHEN 7.5-325 MG PO TABS
1.0000 | ORAL_TABLET | ORAL | Status: DC | PRN
  Administered 2018-07-26: 21:00:00 1 via ORAL
  Administered 2018-07-27 (×2): 2 via ORAL
  Filled 2018-07-26 (×2): qty 2
  Filled 2018-07-26: qty 1

## 2018-07-26 MED ORDER — PROPOFOL 10 MG/ML IV BOLUS
INTRAVENOUS | Status: AC
  Filled 2018-07-26: qty 20

## 2018-07-26 MED ORDER — AMLODIPINE BESYLATE 5 MG PO TABS
5.0000 mg | ORAL_TABLET | Freq: Every day | ORAL | Status: DC
  Administered 2018-07-27: 11:00:00 5 mg via ORAL
  Filled 2018-07-26: qty 1

## 2018-07-26 MED ORDER — CEFAZOLIN SODIUM-DEXTROSE 2-4 GM/100ML-% IV SOLN
2.0000 g | INTRAVENOUS | Status: AC
  Administered 2018-07-26: 2 g via INTRAVENOUS
  Filled 2018-07-26: qty 100

## 2018-07-26 MED ORDER — ASPIRIN EC 325 MG PO TBEC
325.0000 mg | DELAYED_RELEASE_TABLET | Freq: Every day | ORAL | Status: DC
  Administered 2018-07-27 – 2018-07-28 (×2): 325 mg via ORAL
  Filled 2018-07-26 (×2): qty 1

## 2018-07-26 MED ORDER — HYDROCODONE-ACETAMINOPHEN 5-325 MG PO TABS
1.0000 | ORAL_TABLET | ORAL | Status: DC | PRN
  Administered 2018-07-27: 2 via ORAL
  Administered 2018-07-28: 07:00:00 1 via ORAL
  Filled 2018-07-26 (×2): qty 2

## 2018-07-26 MED ORDER — ACETAMINOPHEN 500 MG PO TABS
500.0000 mg | ORAL_TABLET | Freq: Four times a day (QID) | ORAL | Status: AC
  Administered 2018-07-26 – 2018-07-27 (×3): 500 mg via ORAL
  Filled 2018-07-26 (×5): qty 1

## 2018-07-26 MED ORDER — ONDANSETRON HCL 4 MG/2ML IJ SOLN: 4.0000 mg | Freq: Once | INTRAMUSCULAR | Status: DC | PRN

## 2018-07-26 MED ORDER — FENTANYL CITRATE (PF) 100 MCG/2ML IJ SOLN
INTRAMUSCULAR | Status: DC | PRN
  Administered 2018-07-26: 100 ug via INTRAVENOUS

## 2018-07-26 MED ORDER — FENTANYL CITRATE (PF) 100 MCG/2ML IJ SOLN
INTRAMUSCULAR | Status: AC
  Filled 2018-07-26: qty 2

## 2018-07-26 MED ORDER — TRANEXAMIC ACID 1000 MG/10ML IV SOLN
2000.0000 mg | Freq: Once | INTRAVENOUS | Status: DC
  Filled 2018-07-26: qty 20

## 2018-07-26 MED ORDER — DULOXETINE HCL 20 MG PO CPEP
20.0000 mg | ORAL_CAPSULE | Freq: Every day | ORAL | Status: DC
  Administered 2018-07-27: 11:00:00 20 mg via ORAL
  Filled 2018-07-26 (×2): qty 1

## 2018-07-26 MED ORDER — ONDANSETRON HCL 4 MG/2ML IJ SOLN
4.0000 mg | Freq: Four times a day (QID) | INTRAMUSCULAR | Status: DC | PRN
  Filled 2018-07-26: qty 2

## 2018-07-26 MED ORDER — DIPHENHYDRAMINE HCL 12.5 MG/5ML PO ELIX
12.5000 mg | ORAL_SOLUTION | ORAL | Status: DC | PRN
  Administered 2018-07-26: 25 mg via ORAL
  Filled 2018-07-26: qty 10

## 2018-07-26 MED ORDER — MORPHINE SULFATE (PF) 2 MG/ML IV SOLN: 0.5000 mg | INTRAVENOUS | Status: DC | PRN

## 2018-07-26 MED ORDER — POLYETHYLENE GLYCOL 3350 17 G PO PACK: 17.0000 g | PACK | Freq: Every day | ORAL | Status: DC | PRN

## 2018-07-26 MED ORDER — POVIDONE-IODINE 10 % EX SWAB: 2.0000 "application " | Freq: Once | CUTANEOUS | Status: DC

## 2018-07-26 MED ORDER — DEXAMETHASONE SODIUM PHOSPHATE 10 MG/ML IJ SOLN: 10.0000 mg | Freq: Once | INTRAMUSCULAR | Status: DC

## 2018-07-26 MED ORDER — PHENOL 1.4 % MT LIQD: 1.0000 | OROMUCOSAL | Status: DC | PRN

## 2018-07-26 MED ORDER — LACTATED RINGERS IV SOLN
INTRAVENOUS | Status: DC
  Administered 2018-07-26 (×2): via INTRAVENOUS

## 2018-07-26 MED ORDER — ONDANSETRON HCL 4 MG/2ML IJ SOLN
INTRAMUSCULAR | Status: AC
  Filled 2018-07-26: qty 2

## 2018-07-26 MED ORDER — FENTANYL CITRATE (PF) 100 MCG/2ML IJ SOLN: 25.0000 ug | INTRAMUSCULAR | Status: DC | PRN

## 2018-07-26 MED ORDER — METOCLOPRAMIDE HCL 5 MG PO TABS: 5.0000 mg | ORAL_TABLET | Freq: Three times a day (TID) | ORAL | Status: DC | PRN

## 2018-07-26 MED ORDER — TRANEXAMIC ACID-NACL 1000-0.7 MG/100ML-% IV SOLN
INTRAVENOUS | Status: AC
  Filled 2018-07-26: qty 100

## 2018-07-26 MED ORDER — 0.9 % SODIUM CHLORIDE (POUR BTL) OPTIME
TOPICAL | Status: DC | PRN
  Administered 2018-07-26: 1000 mL

## 2018-07-26 MED ORDER — BUPIVACAINE-EPINEPHRINE (PF) 0.25% -1:200000 IJ SOLN
INTRAMUSCULAR | Status: AC
  Filled 2018-07-26: qty 30

## 2018-07-26 MED ORDER — TRANEXAMIC ACID 1000 MG/10ML IV SOLN
INTRAVENOUS | Status: DC | PRN
  Administered 2018-07-26: 2000 mg via TOPICAL

## 2018-07-26 MED ORDER — DOCUSATE SODIUM 100 MG PO CAPS
100.0000 mg | ORAL_CAPSULE | Freq: Two times a day (BID) | ORAL | Status: DC
  Administered 2018-07-26 – 2018-07-27 (×3): 100 mg via ORAL
  Filled 2018-07-26 (×3): qty 1

## 2018-07-26 MED ORDER — METHOCARBAMOL 500 MG PO TABS
500.0000 mg | ORAL_TABLET | Freq: Four times a day (QID) | ORAL | Status: DC | PRN
  Administered 2018-07-26 – 2018-07-28 (×4): 500 mg via ORAL
  Filled 2018-07-26 (×4): qty 1

## 2018-07-26 MED ORDER — BUPIVACAINE-EPINEPHRINE (PF) 0.25% -1:200000 IJ SOLN
INTRAMUSCULAR | Status: DC | PRN
  Administered 2018-07-26: 30 mL

## 2018-07-26 MED ORDER — ONDANSETRON HCL 4 MG PO TABS: 4.0000 mg | ORAL_TABLET | Freq: Four times a day (QID) | ORAL | Status: DC | PRN

## 2018-07-26 SURGICAL SUPPLY — 48 items
BAG DECANTER FOR FLEXI CONT (MISCELLANEOUS) IMPLANT
BAG ZIPLOCK 12X15 (MISCELLANEOUS) IMPLANT
BALL HIP CERAMIC (Hips) IMPLANT
BLADE SAG 18X100X1.27 (BLADE) ×3 IMPLANT
CLOSURE STERI-STRIP 1/2X4 (GAUZE/BANDAGES/DRESSINGS) ×1
CLOSURE WOUND 1/2 X4 (GAUZE/BANDAGES/DRESSINGS) ×1
CLSR STERI-STRIP ANTIMIC 1/2X4 (GAUZE/BANDAGES/DRESSINGS) ×1 IMPLANT
COVER PERINEAL POST (MISCELLANEOUS) ×3 IMPLANT
COVER SURGICAL LIGHT HANDLE (MISCELLANEOUS) ×3 IMPLANT
COVER WAND RF STERILE (DRAPES) IMPLANT
CUP ACETBLR 52 OD PINNACLE (Hips) ×2 IMPLANT
DECANTER SPIKE VIAL GLASS SM (MISCELLANEOUS) ×3 IMPLANT
DRAPE STERI IOBAN 125X83 (DRAPES) ×3 IMPLANT
DRAPE U-SHAPE 47X51 STRL (DRAPES) ×6 IMPLANT
DRSG ADAPTIC 3X8 NADH LF (GAUZE/BANDAGES/DRESSINGS) ×3 IMPLANT
DRSG MEPILEX BORDER 4X4 (GAUZE/BANDAGES/DRESSINGS) ×3 IMPLANT
DRSG MEPILEX BORDER 4X8 (GAUZE/BANDAGES/DRESSINGS) ×3 IMPLANT
DURAPREP 26ML APPLICATOR (WOUND CARE) ×3 IMPLANT
ELECT REM PT RETURN 15FT ADLT (MISCELLANEOUS) ×3 IMPLANT
EVACUATOR 1/8 PVC DRAIN (DRAIN) ×3 IMPLANT
GLOVE BIO SURGEON STRL SZ 6 (GLOVE) ×2 IMPLANT
GLOVE BIO SURGEON STRL SZ7 (GLOVE) IMPLANT
GLOVE BIO SURGEON STRL SZ8 (GLOVE) ×3 IMPLANT
GLOVE BIOGEL PI IND STRL 6.5 (GLOVE) IMPLANT
GLOVE BIOGEL PI IND STRL 7.0 (GLOVE) IMPLANT
GLOVE BIOGEL PI IND STRL 8 (GLOVE) ×1 IMPLANT
GLOVE BIOGEL PI INDICATOR 6.5 (GLOVE) ×2
GLOVE BIOGEL PI INDICATOR 7.0 (GLOVE)
GLOVE BIOGEL PI INDICATOR 8 (GLOVE) ×2
GOWN STRL REUS W/TWL LRG LVL3 (GOWN DISPOSABLE) ×3 IMPLANT
GOWN STRL REUS W/TWL XL LVL3 (GOWN DISPOSABLE) ×2 IMPLANT
HIP BALL CERAMIC (Hips) ×3 IMPLANT
HOLDER FOLEY CATH W/STRAP (MISCELLANEOUS) ×3 IMPLANT
KIT TURNOVER KIT A (KITS) IMPLANT
LINER MARATHON NEUT +4X52X32 (Hips) ×2 IMPLANT
MANIFOLD NEPTUNE II (INSTRUMENTS) ×3 IMPLANT
PACK ANTERIOR HIP CUSTOM (KITS) ×3 IMPLANT
STEM FEM ACTIS STD SZ7 (Nail) ×2 IMPLANT
STRIP CLOSURE SKIN 1/2X4 (GAUZE/BANDAGES/DRESSINGS) ×2 IMPLANT
SUT ETHIBOND NAB CT1 #1 30IN (SUTURE) ×3 IMPLANT
SUT MNCRL AB 4-0 PS2 18 (SUTURE) ×3 IMPLANT
SUT STRATAFIX 0 PDS 27 VIOLET (SUTURE) ×3
SUT VIC AB 2-0 CT1 27 (SUTURE) ×4
SUT VIC AB 2-0 CT1 TAPERPNT 27 (SUTURE) ×2 IMPLANT
SUTURE STRATFX 0 PDS 27 VIOLET (SUTURE) ×1 IMPLANT
SYR 50ML LL SCALE MARK (SYRINGE) IMPLANT
TRAY FOLEY MTR SLVR 16FR STAT (SET/KITS/TRAYS/PACK) ×3 IMPLANT
YANKAUER SUCT BULB TIP 10FT TU (MISCELLANEOUS) ×3 IMPLANT

## 2018-07-26 NOTE — Discharge Instructions (Signed)
°Dr. Frank Aluisio °Total Joint Specialist °[facility] °[address] °[place], [place] [address] °[phone] ° °ANTERIOR APPROACH TOTAL HIP REPLACEMENT POSTOPERATIVE DIRECTIONS ° ° °Hip Rehabilitation, Guidelines Following Surgery  °The results of a hip operation are greatly improved after range of motion and muscle strengthening exercises. Follow all safety measures which are given to protect your hip. If any of these exercises cause increased pain or swelling in your joint, decrease the amount until you are comfortable again. Then slowly increase the exercises. Call your caregiver if you have problems or questions.  ° °HOME CARE INSTRUCTIONS  °• Remove items at home which could result in a fall. This includes throw rugs or furniture in walking pathways.  °· ICE to the affected hip every three hours for 30 minutes at a time and then as needed for pain and swelling.  Continue to use ice on the hip for pain and swelling from surgery. You may notice swelling that will progress down to the foot and ankle.  This is normal after surgery.  Elevate the leg when you are not up walking on it.   °· Continue to use the breathing machine which will help keep your temperature down.  It is common for your temperature to cycle up and down following surgery, especially at night when you are not up moving around and exerting yourself.  The breathing machine keeps your lungs expanded and your temperature down. ° °DIET °You may resume your previous home diet once your are discharged from the hospital. ° °DRESSING / WOUND CARE / SHOWERING °You may change your dressing 3-5 days after surgery.  Then change the dressing every day with sterile gauze.  Please use good hand washing techniques before changing the dressing.  Do not use any lotions or creams on the incision until instructed by your surgeon. °You may start showering once you are discharged home but do not submerge the incision under water. Just pat the  incision dry and apply a dry gauze dressing on daily. °Change the surgical dressing daily and reapply a dry dressing each time. ° °ACTIVITY °Walk with your walker as instructed. °Use walker as long as suggested by your caregivers. °Avoid periods of inactivity such as sitting longer than an hour when not asleep. This helps prevent blood clots.  °You may resume a sexual relationship in one month or when given the OK by your doctor.  °You may return to work once you are cleared by your doctor.  °Do not drive a car for 6 weeks or until released by you surgeon.  °Do not drive while taking narcotics. ° °WEIGHT BEARING °Weight bearing as tolerated with assist device (walker, cane, etc) as directed, use it as long as suggested by your surgeon or therapist, typically at least 4-6 weeks. ° °POSTOPERATIVE CONSTIPATION PROTOCOL °Constipation - defined medically as fewer than three stools per week and severe constipation as less than one stool per week. ° °One of the most common issues patients have following surgery is constipation.  Even if you have a regular bowel pattern at home, your normal regimen is likely to be disrupted due to multiple reasons following surgery.  Combination of anesthesia, postoperative narcotics, change in appetite and fluid intake all can affect your bowels.  In order to avoid complications following surgery, here are some recommendations in order to help you during your recovery period. ° °Colace (docusate) - Pick up an over-the-counter form of Colace or another stool softener and take twice a day   as long as you are requiring postoperative pain medications.  Take with a full glass of water daily.  If you experience loose stools or diarrhea, hold the colace until you stool forms back up.  If your symptoms do not get better within 1 week or if they get worse, check with your doctor. ° °Dulcolax (bisacodyl) - Pick up over-the-counter and take as directed by the product packaging as needed to assist with  the movement of your bowels.  Take with a full glass of water.  Use this product as needed if not relieved by Colace only.  ° °MiraLax (polyethylene glycol) - Pick up over-the-counter to have on hand.  MiraLax is a solution that will increase the amount of water in your bowels to assist with bowel movements.  Take as directed and can mix with a glass of water, juice, soda, coffee, or tea.  Take if you go more than two days without a movement. °Do not use MiraLax more than once per day. Call your doctor if you are still constipated or irregular after using this medication for 7 days in a row. ° °If you continue to have problems with postoperative constipation, please contact the office for further assistance and recommendations.  If you experience "the worst abdominal pain ever" or develop nausea or vomiting, please contact the office immediatly for further recommendations for treatment. ° °ITCHING ° If you experience itching with your medications, try taking only a single pain pill, or even half a pain pill at a time.  You can also use Benadryl over the counter for itching or also to help with sleep.  ° °TED HOSE STOCKINGS °Wear the elastic stockings on both legs for three weeks following surgery during the day but you may remove then at night for sleeping. ° °MEDICATIONS °See your medication summary on the “After Visit Summary” that the nursing staff will review with you prior to discharge.  You may have some home medications which will be placed on hold until you complete the course of blood thinner medication.  It is important for you to complete the blood thinner medication as prescribed by your surgeon.  Continue your approved medications as instructed at time of discharge. ° °PRECAUTIONS °If you experience chest pain or shortness of breath - call 911 immediately for transfer to the hospital emergency department.  °If you develop a fever greater that 101 F, purulent drainage from wound, increased redness or  drainage from wound, foul odor from the wound/dressing, or calf pain - CONTACT YOUR SURGEON.   °                                                °FOLLOW-UP APPOINTMENTS °Make sure you keep all of your appointments after your operation with your surgeon and caregivers. You should call the office at the above phone number and make an appointment for approximately two weeks after the date of your surgery or on the date instructed by your surgeon outlined in the "After Visit Summary". ° °RANGE OF MOTION AND STRENGTHENING EXERCISES  °These exercises are designed to help you keep full movement of your hip joint. Follow your caregiver's or physical therapist's instructions. Perform all exercises about fifteen times, three times per day or as directed. Exercise both hips, even if you have had only one joint replacement. These exercises can be done on   a training (exercise) mat, on the floor, on a table or on a bed. Use whatever works the best and is most comfortable for you. Use music or television while you are exercising so that the exercises are a pleasant break in your day. This will make your life better with the exercises acting as a break in routine you can look forward to.   Lying on your back, slowly slide your foot toward your buttocks, raising your knee up off the floor. Then slowly slide your foot back down until your leg is straight again.   Lying on your back spread your legs as far apart as you can without causing discomfort.   Lying on your side, raise your upper leg and foot straight up from the floor as far as is comfortable. Slowly lower the leg and repeat.   Lying on your back, tighten up the muscle in the front of your thigh (quadriceps muscles). You can do this by keeping your leg straight and trying to raise your heel off the floor. This helps strengthen the largest muscle supporting your knee.   Lying on your back, tighten up the muscles of your buttocks both with the legs straight and with  the knee bent at a comfortable angle while keeping your heel on the floor.   IF YOU ARE TRANSFERRED TO A SKILLED REHAB FACILITY If the patient is transferred to a skilled rehab facility following release from the hospital, a list of the current medications will be sent to the facility for the patient to continue.  When discharged from the skilled rehab facility, please have the facility set up the patient's [place] prior to being released. Also, the skilled facility will be responsible for providing the patient with their medications at time of release from the facility to include their pain medication, the muscle relaxants, and their blood thinner medication. If the patient is still at the rehab facility at time of the two week follow up appointment, the skilled rehab facility will also need to assist the patient in arranging follow up appointment in our office and any transportation needs.  MAKE SURE YOU:   Understand these instructions.   Get help right away if you are not doing well or get worse.    Pick up stool softner and laxative for home use following surgery while on pain medications. Do not submerge incision under water. Please use good hand washing techniques while changing dressing each day. May shower starting three days after surgery. Please use a clean towel to pat the incision dry following showers. Continue to use ice for pain and swelling after surgery. Do not use any lotions or creams on the incision until instructed by your surgeon.

## 2018-07-26 NOTE — Transfer of Care (Signed)
Immediate Anesthesia Transfer of Care Note  Patient: Bradley Kitchen  Procedure(s) Performed: TOTAL HIP ARTHROPLASTY ANTERIOR APPROACH (Right )  Patient Location: PACU  Anesthesia Type:Spinal  Level of Consciousness: awake, alert , oriented and patient cooperative  Airway & Oxygen Therapy: Patient Spontanous Breathing and Patient connected to face mask  Post-op Assessment: Report given to RN and Post -op Vital signs reviewed and stable  Post vital signs: Reviewed and stable  Last Vitals:  Vitals Value Taken Time  BP 90/54 07/26/18 1701  Temp    Pulse 71 07/26/18 1703  Resp 15 07/26/18 1703  SpO2 99 % 07/26/18 1703  Vitals shown include unvalidated device data.  Last Pain:  Vitals:   07/26/18 1139  TempSrc:   PainSc: 2       Patients Stated Pain Goal: 1 (24/17/53 0104)  Complications: No apparent anesthesia complications

## 2018-07-26 NOTE — Op Note (Signed)
OPERATIVE REPORT- TOTAL HIP ARTHROPLASTY   PREOPERATIVE DIAGNOSIS: Osteoarthritis of the Right hip.   POSTOPERATIVE DIAGNOSIS: Osteoarthritis of the Right  hip.   PROCEDURE: Right total hip arthroplasty, anterior approach.   SURGEON: Gaynelle Arabian, MD   ASSISTANT: Ardeen Jourdain, PA-C  ANESTHESIA:  Spinal  ESTIMATED BLOOD LOSS:-400 mL    DRAINS: Hemovac x1.   COMPLICATIONS: None   CONDITION: PACU - hemodynamically stable.   BRIEF CLINICAL NOTE: Bradley Ayers is a 73 y.o. male who has advanced end-  stage arthritis of their Right  hip with progressively worsening pain and  dysfunction.The patient has failed nonoperative management and presents for  total hip arthroplasty.   PROCEDURE IN DETAIL: After successful administration of spinal  anesthetic, the traction boots for the [facility] bed were placed on both  feet and the patient was placed onto the [facility] bed, boots placed into the leg  holders. The Right hip was then isolated from the perineum with plastic  drapes and prepped and draped in the usual sterile fashion. ASIS and  greater trochanter were marked and a oblique incision was made, starting  at about 1 cm lateral and 2 cm distal to the ASIS and coursing towards  the anterior cortex of the femur. The skin was cut with a 10 blade  through subcutaneous tissue to the level of the fascia overlying the  tensor fascia lata muscle. The fascia was then incised in line with the  incision at the junction of the anterior third and posterior 2/3rd. The  muscle was teased off the fascia and then the interval between the TFL  and the rectus was developed. The Hohmann retractor was then placed at  the top of the femoral neck over the capsule. The vessels overlying the  capsule were cauterized and the fat on top of the capsule was removed.  A Hohmann retractor was then placed anterior underneath the rectus  femoris to give exposure to the entire anterior capsule. A T-shaped   capsulotomy was performed. The edges were tagged and the femoral head  was identified.       Osteophytes are removed off the superior acetabulum.  The femoral neck was then cut in situ with an oscillating saw. Traction  was then applied to the left lower extremity utilizing the [facility]  traction. The femoral head was then removed. Retractors were placed  around the acetabulum and then circumferential removal of the labrum was  performed. Osteophytes were also removed. Reaming starts at 49 mm to  medialize and  Increased in 2 mm increments to 51 mm. We reamed in  approximately 40 degrees of abduction, 20 degrees anteversion. A 52 mm  pinnacle acetabular shell was then impacted in anatomic position under  fluoroscopic guidance with excellent purchase. We did not need to place  any additional dome screws. A 32 mm neutral + 4 marathon liner was then  placed into the acetabular shell.       The femoral lift was then placed along the lateral aspect of the femur  just distal to the vastus ridge. The leg was  externally rotated and capsule  was stripped off the inferior aspect of the femoral neck down to the  level of the lesser trochanter, this was done with electrocautery. The femur was lifted after this was performed. The  leg was then placed in an extended and adducted position essentially delivering the femur. We also removed the capsule superiorly and the piriformis from the piriformis fossa  to gain excellent exposure of the  proximal femur. Rongeur was used to remove some cancellous bone to get  into the lateral portion of the proximal femur for placement of the  initial starter reamer. The starter broaches was placed  the starter broach  and was shown to go down the center of the canal. Broaching  with the Actis system was then performed starting at size 0  coursing  Up to size 7. A size 7 had excellent torsional and rotational  and axial stability. The trial standard offset neck was then  placed  with a 32 + 5 trial head. The hip was then reduced. We confirmed that  the stem was in the canal both on AP and lateral x-rays. It also has excellent sizing. The hip was reduced with outstanding stability through full extension and full external rotation.. AP pelvis was taken and the leg lengths were measured and found to be equal. Hip was then dislocated again and the femoral head and neck removed. The  femoral broach was removed. Size 7 Actis stem with a standard offset  neck was then impacted into the femur following native anteversion. Has  excellent purchase in the canal. Excellent torsional and rotational and  axial stability. It is confirmed to be in the canal on AP and lateral  fluoroscopic views. The 32 + 1 ceramic head was placed and the hip  reduced with outstanding stability. Again AP pelvis was taken and it  confirmed that the leg lengths were equal. The wound was then copiously  irrigated with saline solution and the capsule reattached and repaired  with Ethibond suture. 30 ml of .25% Bupivicaine was  injected into the capsule and into the edge of the tensor fascia lata as well as subcutaneous tissue. The fascia overlying the tensor fascia lata was then closed with a running #1 V-Loc. Subcu was closed with interrupted 2-0 Vicryl and subcuticular running 4-0 Monocryl. Incision was cleaned  and dried. Steri-Strips and a bulky sterile dressing applied. Hemovac  drain was hooked to suction and then the patient was awakened and transported to  recovery in stable condition.        Please note that a surgical assistant was a medical necessity for this procedure to perform it in a safe and expeditious manner. Assistant was necessary to provide appropriate retraction of vital neurovascular structures and to prevent femoral fracture and allow for anatomic placement of the prosthesis.  Gaynelle Arabian, M.D.

## 2018-07-26 NOTE — Anesthesia Procedure Notes (Signed)
Spinal  Patient location during procedure: OR Start time: 07/26/2018 3:20 PM End time: 07/26/2018 3:24 PM Staffing Resident/CRNA: Claudia Desanctis, CRNA Performed: resident/CRNA  Preanesthetic Checklist Completed: patient identified, site marked, surgical consent, pre-op evaluation, timeout performed, IV checked, risks and benefits discussed and monitors and equipment checked Spinal Block Patient position: sitting Prep: DuraPrep Patient monitoring: heart rate, cardiac monitor, continuous pulse ox and blood pressure Approach: midline Location: L3-4 Injection technique: single-shot Needle Needle type: Sprotte  Needle gauge: 24 G Needle length: 10 cm Needle insertion depth: 9 cm Assessment Sensory level: T4

## 2018-07-26 NOTE — Anesthesia Postprocedure Evaluation (Signed)
Anesthesia Post Note  Patient: [name]  Procedure(s) Performed: TOTAL HIP ARTHROPLASTY ANTERIOR APPROACH (Right )     Patient location during evaluation: PACU Anesthesia Type: Spinal Level of consciousness: oriented and awake and alert Pain management: pain level controlled Vital Signs Assessment: post-procedure vital signs reviewed and stable Respiratory status: spontaneous breathing, respiratory function stable and patient connected to nasal cannula oxygen Cardiovascular status: blood pressure returned to baseline and stable Postop Assessment: no headache, no backache, no apparent nausea or vomiting and spinal receding Anesthetic complications: no    Last Vitals:  Vitals:   07/26/18 1830 07/26/18 1849  BP: 108/80 119/66  Pulse: 73 65  Resp: (!) 22 15  Temp:    SpO2: 100% 100%    Last Pain:  Vitals:   07/26/18 1849  TempSrc:   PainSc: 0-No pain    LLE Motor Response: Purposeful movement (07/26/18 1854) LLE Sensation: Numbness;Tingling (07/26/18 1854) RLE Motor Response: Purposeful movement (07/26/18 1854) RLE Sensation: Numbness;Tingling (07/26/18 1854)      Karyl Kinnier Ellender

## 2018-07-26 NOTE — Interval H&P Note (Signed)
History and Physical Interval Note:  07/26/2018 12:56 PM  [name]  has presented today for surgery, with the diagnosis of right hip osteoarthritis.  The various methods of treatment have been discussed with the patient and family. After consideration of risks, benefits and other options for treatment, the patient has consented to  Procedure(s) with comments: [place] (Right) - 147min as a surgical intervention.  The patient's history has been reviewed, patient examined, no change in status, stable for surgery.  I have reviewed the patient's chart and labs.  Questions were answered to the patient's satisfaction.     Pilar Plate Antonious Omahoney

## 2018-07-27 DIAGNOSIS — I10 Essential (primary) hypertension: Secondary | ICD-10-CM | POA: Diagnosis present

## 2018-07-27 DIAGNOSIS — E785 Hyperlipidemia, unspecified: Secondary | ICD-10-CM | POA: Diagnosis present

## 2018-07-27 DIAGNOSIS — Z86718 Personal history of other venous thrombosis and embolism: Secondary | ICD-10-CM | POA: Diagnosis not present

## 2018-07-27 DIAGNOSIS — K219 Gastro-esophageal reflux disease without esophagitis: Secondary | ICD-10-CM | POA: Diagnosis present

## 2018-07-27 DIAGNOSIS — Z6831 Body mass index (BMI) 31.0-31.9, adult: Secondary | ICD-10-CM | POA: Diagnosis not present

## 2018-07-27 DIAGNOSIS — Z1159 Encounter for screening for other viral diseases: Secondary | ICD-10-CM | POA: Diagnosis not present

## 2018-07-27 DIAGNOSIS — G2581 Restless legs syndrome: Secondary | ICD-10-CM | POA: Diagnosis present

## 2018-07-27 DIAGNOSIS — Z8673 Personal history of transient ischemic attack (TIA), and cerebral infarction without residual deficits: Secondary | ICD-10-CM | POA: Diagnosis not present

## 2018-07-27 DIAGNOSIS — M25751 Osteophyte, right hip: Secondary | ICD-10-CM | POA: Diagnosis present

## 2018-07-27 DIAGNOSIS — M1611 Unilateral primary osteoarthritis, right hip: Secondary | ICD-10-CM | POA: Diagnosis present

## 2018-07-27 DIAGNOSIS — E669 Obesity, unspecified: Secondary | ICD-10-CM | POA: Diagnosis present

## 2018-07-27 LAB — BASIC METABOLIC PANEL
Anion gap: 7 (ref 5–15)
BUN: 11 mg/dL (ref 8–23)
CO2: 25 mmol/L (ref 22–32)
Calcium: 8.3 mg/dL — ABNORMAL LOW (ref 8.9–10.3)
Chloride: 104 mmol/L (ref 98–111)
Creatinine, Ser: 0.7 mg/dL (ref 0.61–1.24)
GFR calc Af Amer: >60 mL/min (ref >60)
GFR calc non Af Amer: >60 mL/min (ref >60)
Glucose, Bld: 120 mg/dL — ABNORMAL HIGH (ref 70–99)
Potassium: 3.6 mmol/L (ref 3.5–5.1)
Sodium: 136 mmol/L (ref 135–145)

## 2018-07-27 LAB — CBC
HCT: 33 % — ABNORMAL LOW (ref 39.0–52.0)
Hemoglobin: 10.8 g/dL — ABNORMAL LOW (ref 13.0–17.0)
MCH: 39.3 pg — ABNORMAL HIGH (ref 26.0–34.0)
MCHC: 32.7 g/dL (ref 30.0–36.0)
MCV: 120 fL — ABNORMAL HIGH (ref 80.0–100.0)
Platelets: 338 10*3/uL (ref 150–400)
RBC: 2.75 MIL/uL — ABNORMAL LOW (ref 4.22–5.81)
RDW: 13 % (ref 11.5–15.5)
WBC: 7.2 10*3/uL (ref 4.0–10.5)
nRBC: 0 % (ref 0.0–0.2)

## 2018-07-27 MED ORDER — HYDROCODONE-ACETAMINOPHEN 5-325 MG PO TABS: 1.0000 | ORAL_TABLET | Freq: Four times a day (QID) | ORAL | 0 refills | Status: DC | PRN

## 2018-07-27 MED ORDER — METHOCARBAMOL 500 MG PO TABS: 500.0000 mg | ORAL_TABLET | Freq: Four times a day (QID) | ORAL | 0 refills | Status: DC | PRN

## 2018-07-27 MED ORDER — ASPIRIN 325 MG PO TBEC: 325.0000 mg | DELAYED_RELEASE_TABLET | Freq: Every day | ORAL | 0 refills | Status: AC

## 2018-07-27 NOTE — Progress Notes (Addendum)
Pt has had slower progression with physical therapy, is not yet meeting goals to be cleared for discharge. Will stay overnight and continue working with therapy tomorrow.   Theresa Duty, PA-C

## 2018-07-27 NOTE — TOC Transition Note (Addendum)
Transition of Care [facility]) - CM/SW Discharge Note   Patient Details  Name: Bradley Ayers MRN: [id number] Date of Birth: Oct 03, 1945  [facility] of Care [facility] CM/SW Contact:  Lia Hopping, [place] Phone Number: 07/27/2018, 9:56 AM   Clinical Narrative:    Rolling walker ordered. Has 3 IN 1. HEP after hospital stay.      Final next level of care: Possible discharge with HEP this afternoon pending progress with therapy Barriers to Discharge: No Barriers Identified   Patient Goals and CMS Choice        Discharge Placement  Home                     Discharge Plan and Services                DME Arranged: Walker rolling DME Agency: [employer] Date DME Agency Contacted: 07/27/18 Time DME Agency Contacted: [phone] Representative spoke with at DME Agency: [place] ([place]) Interventions     Readmission Risk Interventions No flowsheet data found.

## 2018-07-27 NOTE — Progress Notes (Signed)
Physical Therapy Treatment Patient Details Name: Bradley Ayers MRN: [id number] DOB: [birth year] Today's Date: 07/27/2018    History of Present Illness Pt is a 73 yo male s/p R THA anterior approach.    PT Comments    Pt is making good progress with mobility. Pt is ambulating on the unit with RW min guard assist. Pt reports some mild dizziness with standing. Pt is requesting d/c home tomorrow. Recommend d/c home in the AM after therapy to reinforce independence with mobility and complete step training. Pt will continue to benefit from acute PT to maximize mobility until d/c home.   Follow Up Recommendations  Follow surgeon's recommendation for DC plan and follow-up therapies     Equipment Recommendations  Rolling walker with 5" wheels    Recommendations for Other Services       Precautions / Restrictions Precautions Precautions: Anterior Hip Restrictions Other Position/Activity Restrictions: WBAT    Mobility  Bed Mobility Overal bed mobility: Needs Assistance Bed Mobility: Supine to Sit     Supine to sit: Min guard     General bed mobility comments: cues for hand placement, use of bed rails  Transfers Overall transfer level: Needs assistance Equipment used: Rolling walker (2 wheeled) Transfers: Sit to/from Stand Sit to Stand: Min guard         General transfer comment: cues for hand placement  Ambulation/Gait Ambulation/Gait assistance: Min guard Gait Distance (Feet): 120 Feet Assistive device: Rolling walker (2 wheeled) Gait Pattern/deviations: Step-through pattern;Decreased stride length;Decreased stance time - right Gait velocity: decreased   General Gait Details: standing rest breaks, c/o mild dizziness when standing and ambulating.   Stairs             Information systems manager mobility: (declined due to pain- agreeable to practice tomorrow)  Modified Rankin (Stroke Patients Only)       Balance Overall balance  assessment: No apparent balance deficits (not formally assessed)                                          Cognition Arousal/Alertness: Awake/alert Behavior During Therapy: WFL for tasks assessed/performed Overall Cognitive Status: Within Functional Limits for tasks assessed                                        Exercises Total Joint Exercises Ankle Circles/Pumps: AROM;Strengthening;Both;10 reps;Seated Quad Sets: AROM;Strengthening;Both;10 reps;Seated Heel Slides: AAROM;Strengthening;Right;10 reps;Seated Hip ABduction/ADduction: AAROM;Strengthening;Right;10 reps;Seated Long Arc Quad: AROM;Strengthening;Both;10 reps;Seated    General Comments        Pertinent Vitals/Pain Pain Assessment: 0-10 Pain Score: 3  Pain Location: R hip Pain Descriptors / Indicators: Discomfort Pain Intervention(s): Limited activity within patient's tolerance;Monitored during session;Ice applied    Home Living Family/patient expects to be discharged to:: Private residence Living Arrangements: Spouse/significant other Available Help at Discharge: Family Type of Home: House Home Access: Stairs to enter Entrance Stairs-Rails: Right Home Layout: Two level;Able to live on main level with bedroom/bathroom Home Equipment: [name] - single point      Prior Function Level of Independence: Independent with assistive device(s)          PT Goals (current goals can now be found in the care plan section) Acute Rehab PT Goals Patient Stated Goal: To walk and d/c home PT Goal Formulation: With patient  Time For Goal Achievement: 08/03/18 Potential to Achieve Goals: Good    Frequency    7X/week      PT Plan Current plan remains appropriate    Co-evaluation              AM-PAC PT "6 Clicks" Mobility   Outcome Measure  Help needed turning from your back to your side while in a flat bed without using bedrails?: A Little Help needed moving from lying on your  back to sitting on the side of a flat bed without using bedrails?: A Little Help needed moving to and from a bed to a chair (including a wheelchair)?: A Little Help needed standing up from a chair using your arms (e.g., wheelchair or bedside chair)?: A Little Help needed to walk in hospital room?: A Little Help needed climbing 3-5 steps with a railing? : A Little 6 Click Score: 18    End of Session Equipment Utilized During Treatment: Gait belt Activity Tolerance: Patient tolerated treatment well Patient left: in chair;with call bell/phone within reach Nurse Communication: Mobility status PT Visit Diagnosis: Difficulty in walking, not elsewhere classified (R26.2)     Time: 0511-0211 PT Time Calculation (min) (ACUTE ONLY): 25 min  Charges:  $Gait Training: 8-22 mins $Therapeutic Exercise: 8-22 mins                     Theodoro Grist, PT   Lelon Mast 07/27/2018, 12:21 PM

## 2018-07-27 NOTE — Care Management CC44 (Signed)
Condition Code 44 Documentation Completed  Patient Details  Name: Bradley Ayers MRN: [id number] Date of Birth: [birth year]   Condition Code 44 given:  Yes Patient signature on Condition Code 44 notice:  Yes Documentation of 2 MD's agreement:  Yes Code 44 added to claim:  Yes    Lia Hopping, LCSW 07/27/2018, 10:47 AM
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
## 2018-07-27 NOTE — Evaluation (Signed)
Physical Therapy Evaluation Patient Details Name: Bradley Ayers MRN: [id number] DOB: 1945/05/02 Today's Date: 07/27/2018   History of Present Illness  Pt is a 73 yo male s/p R THA anterior approach.  Clinical Impression  Pt presents with dependencies in mobility affecting his Independence secondary to R THR. Pt will benefit from skilled PT to maximize mobility and Independence for return home with family and PT services per surgeon recommendations.    Follow Up Recommendations Follow surgeon's recommendation for DC plan and follow-up therapies    Equipment Recommendations  Rolling walker with 5" wheels    Recommendations for Other Services       Precautions / Restrictions Precautions Precautions: Anterior Hip Restrictions Weight Bearing Restrictions: No Other Position/Activity Restrictions: WBAT      Mobility  Bed Mobility Overal bed mobility: Needs Assistance Bed Mobility: Supine to Sit     Supine to sit: Min guard     General bed mobility comments: cues for hand placement, use of bed rails  Transfers Overall transfer level: Needs assistance Equipment used: Rolling walker (2 wheeled) Transfers: Sit to/from Stand Sit to Stand: Min assist         General transfer comment: cues for hand placement  Ambulation/Gait Ambulation/Gait assistance: Min guard Gait Distance (Feet): 80 Feet Assistive device: Rolling walker (2 wheeled) Gait Pattern/deviations: Step-through pattern;Decreased stride length;Decreased stance time - right Gait velocity: decreased      Stairs            Wheelchair Mobility    Modified Rankin (Stroke Patients Only)       Balance Overall balance assessment: No apparent balance deficits (not formally assessed)                                           Pertinent Vitals/Pain Pain Assessment: 0-10 Pain Score: 5  Pain Location: R hip Pain Descriptors / Indicators: Discomfort Pain Intervention(s): Limited activity  within patient's tolerance;Monitored during session;Ice applied    Home Living Family/patient expects to be discharged to:: Private residence Living Arrangements: Spouse/significant other Available Help at Discharge: Family Type of Home: House       Home Layout: Two level;Able to live on main level with bedroom/bathroom Home Equipment: [name] - single point      Prior Function Level of Independence: Independent with assistive device(s)               Hand Dominance        Extremity/Trunk Assessment   Upper Extremity Assessment Upper Extremity Assessment: Defer to OT evaluation    Lower Extremity Assessment Lower Extremity Assessment: RLE deficits/detail RLE: Unable to fully assess due to pain       Communication   Communication: No difficulties  Cognition Arousal/Alertness: Awake/alert Behavior During Therapy: WFL for tasks assessed/performed Overall Cognitive Status: Within Functional Limits for tasks assessed                                        General Comments      Exercises Total Joint Exercises Ankle Circles/Pumps: AROM;Strengthening;Both;10 reps;Supine Quad Sets: AROM;Strengthening;Both;10 reps;Supine Heel Slides: AAROM;Strengthening;Right;10 reps;Supine Hip ABduction/ADduction: AAROM;Strengthening;Right;10 reps;Supine Long Arc Quad: AROM;Strengthening;Both;10 reps;Seated   Assessment/Plan    PT Assessment Patient needs continued PT services  PT Problem List Decreased strength;Decreased mobility;Decreased safety awareness;Decreased activity tolerance;Decreased range  of motion       PT Treatment Interventions DME instruction;Therapeutic activities;Gait training;Therapeutic exercise;Patient/family education;Stair training;Neuromuscular re-education;Functional mobility training    PT Goals (Current goals can be found in the Care Plan section)  Acute Rehab PT Goals Patient Stated Goal: To walk and d/c home PT Goal Formulation:  With patient Time For Goal Achievement: 08/03/18 Potential to Achieve Goals: Good    Frequency 7X/week   Barriers to discharge        Co-evaluation               AM-PAC PT "6 Clicks" Mobility  Outcome Measure Help needed turning from your back to your side while in a flat bed without using bedrails?: A Little Help needed moving from lying on your back to sitting on the side of a flat bed without using bedrails?: A Little Help needed moving to and from a bed to a chair (including a wheelchair)?: A Little Help needed standing up from a chair using your arms (e.g., wheelchair or bedside chair)?: A Little Help needed to walk in hospital room?: A Little Help needed climbing 3-5 steps with a railing? : A Little 6 Click Score: 18    End of Session Equipment Utilized During Treatment: Gait belt Activity Tolerance: Patient tolerated treatment well Patient left: in chair;with call bell/phone within reach Nurse Communication: Mobility status PT Visit Diagnosis: Difficulty in walking, not elsewhere classified (R26.2)    Time: 4497-5300 PT Time Calculation (min) (ACUTE ONLY): 28 min   Charges:   PT Evaluation $PT Eval Low Complexity: 1 Low PT Treatments $Gait Training: 8-22 mins        Theodoro Grist, PT  Lelon Mast 07/27/2018, 11:43 AM

## 2018-07-27 NOTE — Care Management Obs Status (Signed)
[place] NOTIFICATION   Patient Details  Name: Bradley Ayers MRN: [id number] Date of Birth: 06-Apr-1945   Medicare Observation Status Notification Given:  Yes    Lia Hopping, LCSW 07/27/2018, 10:47 AM

## 2018-07-28 LAB — BASIC METABOLIC PANEL
Anion gap: 6 (ref 5–15)
BUN: 11 mg/dL (ref 8–23)
CO2: 26 mmol/L (ref 22–32)
Calcium: 8.2 mg/dL — ABNORMAL LOW (ref 8.9–10.3)
Chloride: 104 mmol/L (ref 98–111)
Creatinine, Ser: 0.69 mg/dL (ref 0.61–1.24)
GFR calc Af Amer: >60 mL/min (ref >60)
GFR calc non Af Amer: >60 mL/min (ref >60)
Glucose, Bld: 129 mg/dL — ABNORMAL HIGH (ref 70–99)
Potassium: 3.8 mmol/L (ref 3.5–5.1)
Sodium: 136 mmol/L (ref 135–145)

## 2018-07-28 LAB — CBC
HCT: 29.4 % — ABNORMAL LOW (ref 39.0–52.0)
Hemoglobin: 9.8 g/dL — ABNORMAL LOW (ref 13.0–17.0)
MCH: 39.8 pg — ABNORMAL HIGH (ref 26.0–34.0)
MCHC: 33.3 g/dL (ref 30.0–36.0)
MCV: 119.5 fL — ABNORMAL HIGH (ref 80.0–100.0)
Platelets: 326 10*3/uL (ref 150–400)
RBC: 2.46 MIL/uL — ABNORMAL LOW (ref 4.22–5.81)
RDW: 12.6 % (ref 11.5–15.5)
WBC: 5.5 10*3/uL (ref 4.0–10.5)
nRBC: 0 % (ref 0.0–0.2)

## 2018-07-28 NOTE — Plan of Care (Signed)
Patient discharged home in stable condition. Waiting for his ride

## 2018-07-28 NOTE — Discharge Summary (Signed)
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    Gaynelle Arabian, MD. Schedule an appointment as soon as possible for a visit on 08/10/2018.   Specialty: Orthopedic Surgery Contact information: [address] [place] [address] [phone]           Signed: Griffith Citron, PA-C Orthopedic Surgery 07/28/2018, 1:15 PM

## 2018-07-28 NOTE — Progress Notes (Signed)
Physical Therapy Treatment Patient Details Name: Bradley Ayers MRN: [id number] DOB: 11-May-1945 Today's Date: 07/28/2018    History of Present Illness Pt is a 73 yo male s/p R THA anterior approach.    PT Comments    Pt has met all PT goals for mobility. Pt is mod Independent with RW on the unit. Pt does has decreased activity tolerance and R LE weakness. Pt is safe to d/c home this morning with family. Pt is educated on step training and HEP. Pt will follow-up with HHPT services per dr. Recommendations. Pt is d/c from acute PT services.   Follow Up Recommendations  Follow surgeon's recommendation for DC plan and follow-up therapies     Equipment Recommendations  Rolling walker with 5" wheels    Recommendations for Other Services       Precautions / Restrictions Precautions Precautions: Anterior Hip Restrictions Other Position/Activity Restrictions: WBAT    Mobility  Bed Mobility Overal bed mobility: Modified Independent Bed Mobility: Supine to Sit     Supine to sit: Modified independent (Device/Increase time)        Transfers Overall transfer level: Modified independent Equipment used: Rolling walker (2 wheeled) Transfers: Sit to/from Stand Sit to Stand: Modified independent (Device/Increase time)            Ambulation/Gait Ambulation/Gait assistance: Modified independent (Device/Increase time) Gait Distance (Feet): 135 Feet Assistive device: Rolling walker (2 wheeled) Gait Pattern/deviations: Step-through pattern;Decreased stride length;Decreased weight shift to right Gait velocity: decreased       Stairs Stairs: Yes Stairs assistance: Min guard Stair Management: One rail Left Number of Stairs: 3     Wheelchair Mobility    Modified Rankin (Stroke Patients Only)       Balance                                            Cognition Arousal/Alertness: Awake/alert Behavior During Therapy: WFL for tasks  assessed/performed Overall Cognitive Status: Within Functional Limits for tasks assessed                                        Exercises Total Joint Exercises Ankle Circles/Pumps: AROM;Strengthening;Both;10 reps;Seated Quad Sets: AROM;Strengthening;Both;10 reps;Seated Heel Slides: AAROM;Strengthening;Right;10 reps;Supine Hip ABduction/ADduction: AAROM;Strengthening;Right;10 reps;Seated Long Arc Quad: AROM;Strengthening;Right;10 reps;Seated    General Comments        Pertinent Vitals/Pain Pain Assessment: 0-10 Pain Score: 4  Pain Location: R hip Pain Descriptors / Indicators: Discomfort Pain Intervention(s): Limited activity within patient's tolerance;Monitored during session;Ice applied    Home Living                      Prior Function            PT Goals (current goals can now be found in the care plan section) Progress towards PT goals: Goals met/education completed, patient discharged from PT    Frequency    7X/week      PT Plan Current plan remains appropriate    Co-evaluation              AM-PAC PT "6 Clicks" Mobility   Outcome Measure  Help needed turning from your back to your side while in a flat bed without using bedrails?: None Help needed moving from lying on your back to  sitting on the side of a flat bed without using bedrails?: None Help needed moving to and from a bed to a chair (including a wheelchair)?: None Help needed standing up from a chair using your arms (e.g., wheelchair or bedside chair)?: None Help needed to walk in hospital room?: None Help needed climbing 3-5 steps with a railing? : A Little 6 Click Score: 23    End of Session Equipment Utilized During Treatment: Gait belt Activity Tolerance: Patient tolerated treatment well Patient left: in chair;with call bell/phone within reach Nurse Communication: Mobility status PT Visit Diagnosis: Difficulty in walking, not elsewhere classified (R26.2)      Time: 1683-7290 PT Time Calculation (min) (ACUTE ONLY): 23 min  Charges:  $Gait Training: 8-22 mins $Therapeutic Exercise: 8-22 mins                     Theodoro Grist, PT   Lelon Mast 07/28/2018, 8:56 AM
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
## 2018-07-30 ENCOUNTER — Encounter (HOSPITAL_COMMUNITY): Payer: Self-pay | Admitting: Orthopedic Surgery

## 2018-10-15 ENCOUNTER — Other Ambulatory Visit (HOSPITAL_COMMUNITY): Payer: Self-pay | Admitting: *Deleted

## 2018-10-15 NOTE — Patient Instructions (Addendum)
DUE TO COVID-19 ONLY ONE VISITOR IS ALLOWED TO COME WITH YOU AND STAY IN THE WAITING ROOM ONLY DURING PRE OP AND PROCEDURE DAY OF SURGERY. THE 1 VISITOR MAY VISIT WITH YOU AFTER SURGERY IN YOUR PRIVATE ROOM DURING VISITING HOURS ONLY!  YOU NEED TO HAVE A COVID 19 TEST ON_ 10-23-2018 at , THIS TEST MUST BE DONE BEFORE SURGERY, COME  [place], [place] , [address].  ([place]) ONCE YOUR COVID TEST IS COMPLETED, PLEASE BEGIN THE QUARANTINE INSTRUCTIONS AS OUTLINED IN YOUR HANDOUT.                Bradley Ayers    Your procedure is scheduled on:10-27-2018    Report to [facility]  Entrance   Report to admitting at 1200 AM     Call this number if you have problems the morning of surgery [phone]    Remember: [place], NO [place].   NO SOLID FOOD AFTER MIDNIGHT THE NIGHT PRIOR TO SURGERY. NOTHING BY MOUTH EXCEPT CLEAR LIQUIDS UNTIL 1130 am .   PLEASE FINISH ENSURE DRINK PER SURGEON ORDER  WHICH NEEDS TO BE COMPLETED AT 1130 am .   CLEAR LIQUID DIET   Foods Allowed                                                                     Foods Excluded  Coffee and tea, regular and decaf                             liquids that you cannot  Plain Jell-O any favor except red or purple                                           see through such as: Fruit ices (not with fruit pulp)                                     milk, soups, orange juice  Iced Popsicles                                    All solid food Carbonated beverages, regular and diet                                    Cranberry, grape and apple juices Sports drinks like Gatorade Lightly seasoned clear broth or consume(fat free) Sugar, honey syrup  Sample Menu Breakfast                                Lunch  Supper Cranberry juice                    Beef broth                             Chicken broth Jell-O                                     Grape juice                           Apple juice Coffee or tea                        Jell-O                                      Popsicle                                                Coffee or tea                        Coffee or tea  _____________________________________________________________________     Take these medicines the morning of surgery with A SIP OF WATER: hydrocodone if needed, escitalopram (lexapro), amlodipine (norvasc), flonase nasal spray, alfuzosin (uroxatral), finasteride (proscar). pantaprazole (protonix)                                 You may not have any metal on your body including hair pins and              piercings  Do not wear jewelry, make-up, lotions, powders or perfumes, deodorant                      Men may shave face and neck.   Do not bring valuables to the hospital. [place].  Contacts, dentures or bridgework may not be worn into surgery.  Leave suitcase in the car. After surgery it may be brought to your room.                 Please read over the following fact sheets you were given: _____________________________________________________________________             [facility] - Preparing for Surgery Before surgery, you can play an important role.  Because skin is not sterile, your skin needs to be as free of germs as possible.  You can reduce the number of germs on your skin by washing with CHG (chlorahexidine gluconate) soap before surgery.  CHG is an antiseptic cleaner which kills germs and bonds with the skin to continue killing germs even after washing. Please DO NOT use if you have an allergy to CHG or antibacterial soaps.  If your skin becomes reddened/irritated stop using the CHG and inform your nurse when you arrive at Short Stay. Do not shave (including legs and  underarms) for at least 48 hours prior to the first CHG shower.   You may shave your face/neck. Please follow these instructions carefully:  1.  Shower with CHG Soap the night before surgery and the  morning of Surgery.  2.  If you choose to wash your hair, wash your hair first as usual with your  normal  shampoo.  3.  After you shampoo, rinse your hair and body thoroughly to remove the  shampoo.                           4.  Use CHG as you would any other liquid soap.  You can apply chg directly  to the skin and wash                       Gently with a scrungie or clean washcloth.  5.  Apply the CHG Soap to your body ONLY FROM THE NECK DOWN.   Do not use on face/ open                           Wound or open sores. Avoid contact with eyes, ears mouth and genitals (private parts).                       Wash face,  Genitals (private parts) with your normal soap.             6.  Wash thoroughly, paying special attention to the area where your surgery  will be performed.  7.  Thoroughly rinse your body with warm water from the neck down.  8.  DO NOT shower/wash with your normal soap after using and rinsing off  the CHG Soap.                9.  Pat yourself dry with a clean towel.            10.  Wear clean pajamas.            11.  Place clean sheets on your bed the night of your first shower and do not  sleep with pets. Day of Surgery : Do not apply any lotions/deodorants the morning of surgery.  Please wear clean clothes to the hospital/surgery center.  FAILURE TO FOLLOW THESE INSTRUCTIONS MAY RESULT IN THE CANCELLATION OF YOUR SURGERY PATIENT SIGNATURE_________________________________  NURSE SIGNATURE__________________________________  ________________________________________________________________________   [name]  An incentive spirometer is a tool that can help keep your lungs clear and active. This tool measures how well you are filling your lungs with each breath. Taking long deep breaths may help reverse or decrease the chance of  developing breathing (pulmonary) problems (especially infection) following:  A long period of time when you are unable to move or be active. BEFORE THE PROCEDURE   If the spirometer includes an indicator to show your best effort, your nurse or respiratory therapist will set it to a desired goal.  If possible, sit up straight or lean slightly forward. Try not to slouch.  Hold the incentive spirometer in an upright position. INSTRUCTIONS FOR USE  1. Sit on the edge of your bed if possible, or sit up as far as you can in bed or on a chair. 2. Hold the incentive spirometer in an upright position. 3. Breathe out normally. 4. Place the mouthpiece in your mouth and seal your  lips tightly around it. 5. Breathe in slowly and as deeply as possible, raising the piston or the ball toward the top of the column. 6. Hold your breath for 3-5 seconds or for as long as possible. Allow the piston or ball to fall to the bottom of the column. 7. Remove the mouthpiece from your mouth and breathe out normally. 8. Rest for a few seconds and repeat Steps 1 through 7 at least 10 times every 1-2 hours when you are awake. Take your time and take a few normal breaths between deep breaths. 9. The spirometer may include an indicator to show your best effort. Use the indicator as a goal to work toward during each repetition. 10. After each set of 10 deep breaths, practice coughing to be sure your lungs are clear. If you have an incision (the cut made at the time of surgery), support your incision when coughing by placing a pillow or rolled up towels firmly against it. Once you are able to get out of bed, walk around indoors and cough well. You may stop using the incentive spirometer when instructed by your caregiver.  RISKS AND COMPLICATIONS  Take your time so you do not get dizzy or light-headed.  If you are in pain, you may need to take or ask for pain medication before doing incentive spirometry. It is harder to take a  deep breath if you are having pain. AFTER USE  Rest and breathe slowly and easily.  It can be helpful to keep track of a log of your progress. Your caregiver can provide you with a simple table to help with this. If you are using the spirometer at home, follow these instructions: [place] IF:   You are having difficultly using the spirometer.  You have trouble using the spirometer as often as instructed.  Your pain medication is not giving enough relief while using the spirometer.  You develop fever of 100.5 F (38.1 C) or higher. SEEK IMMEDIATE MEDICAL CARE IF:   You cough up bloody sputum that had not been present before.  You develop fever of 102 F (38.9 C) or greater.  You develop worsening pain at or near the incision site. MAKE SURE YOU:   Understand these instructions.  Will watch your condition.  Will get help right away if you are not doing well or get worse. Document Released: 06/02/2006 Document Revised: 04/14/2011 Document Reviewed: 08/03/2006 ExitCare Patient Information 2014 ExitCare, [place].   ________________________________________________________________________  WHAT IS A BLOOD TRANSFUSION? Blood Transfusion Information  A transfusion is the replacement of blood or some of its parts. Blood is made up of multiple cells which provide different functions.  Red blood cells carry oxygen and are used for blood loss replacement.  White blood cells fight against infection.  Platelets control bleeding.  Plasma helps clot blood.  Other blood products are available for specialized needs, such as hemophilia or other clotting disorders. BEFORE THE TRANSFUSION  Who gives blood for transfusions?   Healthy volunteers who are fully evaluated to make sure their blood is safe. This is blood bank blood. Transfusion therapy is the safest it has ever been in the practice of medicine. Before blood is taken from a donor, a complete history is taken to make  sure that person has no history of diseases nor engages in risky social behavior (examples are intravenous drug use or sexual activity with multiple partners). The donor's travel history is screened to minimize risk of transmitting infections, such as  malaria. The donated blood is tested for signs of infectious diseases, such as HIV and hepatitis. The blood is then tested to be sure it is compatible with you in order to minimize the chance of a transfusion reaction. If you or a relative donates blood, this is often done in anticipation of surgery and is not appropriate for emergency situations. It takes many days to process the donated blood. RISKS AND COMPLICATIONS Although transfusion therapy is very safe and saves many lives, the main dangers of transfusion include:   Getting an infectious disease.  Developing a transfusion reaction. This is an allergic reaction to something in the blood you were given. Every precaution is taken to prevent this. The decision to have a blood transfusion has been considered carefully by your caregiver before blood is given. Blood is not given unless the benefits outweigh the risks. AFTER THE TRANSFUSION  Right after receiving a blood transfusion, you will usually feel much better and more energetic. This is especially true if your red blood cells have gotten low (anemic). The transfusion raises the level of the red blood cells which carry oxygen, and this usually causes an energy increase.  The nurse administering the transfusion will monitor you carefully for complications. HOME CARE INSTRUCTIONS  No special instructions are needed after a transfusion. You may find your energy is better. Speak with your caregiver about any limitations on activity for underlying diseases you may have. SEEK MEDICAL CARE IF:   Your condition is not improving after your transfusion.  You develop redness or irritation at the intravenous (IV) site. SEEK IMMEDIATE MEDICAL CARE IF:   Any of the following symptoms occur over the next 12 hours:  Shaking chills.  You have a temperature by mouth above 102 F (38.9 C), not controlled by medicine.  Chest, back, or muscle pain.  People around you feel you are not acting correctly or are confused.  Shortness of breath or difficulty breathing.  Dizziness and fainting.  You get a rash or develop hives.  You have a decrease in urine output.  Your urine turns a dark color or changes to pink, red, or brown. Any of the following symptoms occur over the next 10 days:  You have a temperature by mouth above 102 F (38.9 C), not controlled by medicine.  Shortness of breath.  Weakness after normal activity.  The white part of the eye turns yellow (jaundice).  You have a decrease in the amount of urine or are urinating less often.  Your urine turns a dark color or changes to pink, red, or brown. Document Released: 01/18/2000 Document Revised: 04/14/2011 Document Reviewed: 09/06/2007 [facility] Patient Information 2014 [employer], [place].  _______________________________________________________________________

## 2018-10-20 ENCOUNTER — Encounter (HOSPITAL_COMMUNITY)
Admission: RE | Admit: 2018-10-20 | Discharge: 2018-10-20 | Disposition: A | Discharge status: Home / Self Care | Payer: Medicare Other | Source: Ambulatory Visit | Attending: Orthopedic Surgery | Admitting: Orthopedic Surgery

## 2018-10-20 ENCOUNTER — Other Ambulatory Visit: Payer: Self-pay

## 2018-10-20 ENCOUNTER — Encounter (HOSPITAL_COMMUNITY): Payer: Self-pay

## 2018-10-20 DIAGNOSIS — Z7982 Long term (current) use of aspirin: Secondary | ICD-10-CM | POA: Diagnosis not present

## 2018-10-20 DIAGNOSIS — Z8673 Personal history of transient ischemic attack (TIA), and cerebral infarction without residual deficits: Secondary | ICD-10-CM | POA: Insufficient documentation

## 2018-10-20 DIAGNOSIS — Z86718 Personal history of other venous thrombosis and embolism: Secondary | ICD-10-CM | POA: Insufficient documentation

## 2018-10-20 DIAGNOSIS — K219 Gastro-esophageal reflux disease without esophagitis: Secondary | ICD-10-CM | POA: Insufficient documentation

## 2018-10-20 DIAGNOSIS — M1612 Unilateral primary osteoarthritis, left hip: Secondary | ICD-10-CM | POA: Diagnosis not present

## 2018-10-20 DIAGNOSIS — Z01812 Encounter for preprocedural laboratory examination: Secondary | ICD-10-CM | POA: Insufficient documentation

## 2018-10-20 DIAGNOSIS — Z7902 Long term (current) use of antithrombotics/antiplatelets: Secondary | ICD-10-CM | POA: Insufficient documentation

## 2018-10-20 DIAGNOSIS — E785 Hyperlipidemia, unspecified: Secondary | ICD-10-CM | POA: Diagnosis not present

## 2018-10-20 DIAGNOSIS — N400 Enlarged prostate without lower urinary tract symptoms: Secondary | ICD-10-CM | POA: Insufficient documentation

## 2018-10-20 DIAGNOSIS — Z79899 Other long term (current) drug therapy: Secondary | ICD-10-CM | POA: Diagnosis not present

## 2018-10-20 DIAGNOSIS — I10 Essential (primary) hypertension: Secondary | ICD-10-CM | POA: Insufficient documentation

## 2018-10-20 LAB — COMPREHENSIVE METABOLIC PANEL
ALT: 19 U/L (ref 0–44)
AST: 24 U/L (ref 15–41)
Albumin: 4.2 g/dL (ref 3.5–5.0)
Alkaline Phosphatase: 63 U/L (ref 38–126)
Anion gap: 7 (ref 5–15)
BUN: 20 mg/dL (ref 8–23)
CO2: 28 mmol/L (ref 22–32)
Calcium: 9.2 mg/dL (ref 8.9–10.3)
Chloride: 104 mmol/L (ref 98–111)
Creatinine, Ser: 0.84 mg/dL (ref 0.61–1.24)
GFR calc Af Amer: >60 mL/min (ref >60)
GFR calc non Af Amer: >60 mL/min (ref >60)
Glucose, Bld: 102 mg/dL — ABNORMAL HIGH (ref 70–99)
Potassium: 5 mmol/L (ref 3.5–5.1)
Sodium: 139 mmol/L (ref 135–145)
Total Bilirubin: 0.6 mg/dL (ref 0.3–1.2)
Total Protein: 6.9 g/dL (ref 6.5–8.1)

## 2018-10-20 LAB — PROTIME-INR
INR: 1 (ref 0.8–1.2)
Prothrombin Time: 13.4 seconds (ref 11.4–15.2)

## 2018-10-20 LAB — CBC
HCT: 39.9 % (ref 39.0–52.0)
Hemoglobin: 13.4 g/dL (ref 13.0–17.0)
MCH: 40 pg — ABNORMAL HIGH (ref 26.0–34.0)
MCHC: 33.6 g/dL (ref 30.0–36.0)
MCV: 119.1 fL — ABNORMAL HIGH (ref 80.0–100.0)
Platelets: 375 10*3/uL (ref 150–400)
RBC: 3.35 MIL/uL — ABNORMAL LOW (ref 4.22–5.81)
RDW: 14.2 % (ref 11.5–15.5)
WBC: 5.7 10*3/uL (ref 4.0–10.5)
nRBC: 0 % (ref 0.0–0.2)

## 2018-10-20 LAB — SURGICAL PCR SCREEN
MRSA, PCR: NEGATIVE
Staphylococcus aureus: NEGATIVE

## 2018-10-20 LAB — APTT: aPTT: 31 seconds (ref 24–36)

## 2018-10-20 NOTE — Progress Notes (Addendum)
PCP - dr Jeralene Huff Cardiologist - none  Chest x-Bovenzi - none EKG - 07-21-18 epic Stress Test - none ECHO - 09-29-17 [facility] Cardiac Cath - none  Sleep Study - none CPAP - none  lov dr Ermalene Postin nerulogy 04-30-18 [facility]  ct head and neck 09-29-17 care everywhere Fasting Blood Sugar -n/a  Checks Blood Sugar _____ times a day  lov  Dr Cruzita Lederer onclogy 03-17-18 [facility] Blood Thinner Instructions:plavix stop Friday 10-22-18 per dr Cruzita Lederer Aspirin Instructions:stop Friday 10-22-18 per dr Cruzita Lederer Last Dose:  Anesthesia review: chart to jessica zanetto pa for review  Patient denies shortness of breath, fever, cough and chest pain at PAT appointment   Patient verbalized understanding of instructions that were given to them at the PAT appointment. Patient was also instructed that they will need to review over the PAT instructions again at home before surgery.
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
## 2018-10-23 ENCOUNTER — Other Ambulatory Visit (HOSPITAL_COMMUNITY)
Admission: RE | Admit: 2018-10-23 | Discharge: 2018-10-23 | Disposition: A | Discharge status: Home / Self Care | Payer: Medicare Other | Source: Ambulatory Visit | Attending: Orthopedic Surgery | Admitting: Orthopedic Surgery

## 2018-10-23 DIAGNOSIS — Z01812 Encounter for preprocedural laboratory examination: Secondary | ICD-10-CM | POA: Insufficient documentation

## 2018-10-23 DIAGNOSIS — Z20828 Contact with and (suspected) exposure to other viral communicable diseases: Secondary | ICD-10-CM | POA: Insufficient documentation

## 2018-10-24 LAB — NOVEL CORONAVIRUS, NAA (HOSP ORDER, SEND-OUT TO REF LAB; TAT 18-24 HRS): SARS-CoV-2, NAA: NOT DETECTED

## 2018-10-26 MED ORDER — TRANEXAMIC ACID 1000 MG/10ML IV SOLN
2000.0000 mg | Freq: Once | INTRAVENOUS | Status: AC
  Filled 2018-10-26: qty 20

## 2018-10-27 ENCOUNTER — Encounter (HOSPITAL_COMMUNITY)
Admission: RE | Disposition: A | Discharge status: Home / Self Care | Payer: Self-pay | Source: Home / Self Care | Attending: Orthopedic Surgery

## 2018-10-27 ENCOUNTER — Inpatient Hospital Stay (HOSPITAL_COMMUNITY): Payer: Medicare Other

## 2018-10-27 ENCOUNTER — Encounter (HOSPITAL_COMMUNITY): Payer: Self-pay | Admitting: Emergency Medicine

## 2018-10-27 ENCOUNTER — Other Ambulatory Visit: Payer: Self-pay

## 2018-10-27 ENCOUNTER — Inpatient Hospital Stay (HOSPITAL_COMMUNITY): Payer: Medicare Other | Admitting: Anesthesiology

## 2018-10-27 ENCOUNTER — Telehealth (HOSPITAL_COMMUNITY): Payer: Self-pay | Admitting: *Deleted

## 2018-10-27 ENCOUNTER — Inpatient Hospital Stay (HOSPITAL_COMMUNITY): Payer: Medicare Other | Admitting: Physician Assistant

## 2018-10-27 ENCOUNTER — Observation Stay (HOSPITAL_COMMUNITY)
Admission: RE | Admit: 2018-10-27 | Discharge: 2018-10-28 | Disposition: A | Discharge status: Home / Self Care | Payer: Medicare Other | Attending: Orthopedic Surgery | Admitting: Orthopedic Surgery

## 2018-10-27 DIAGNOSIS — M169 Osteoarthritis of hip, unspecified: Secondary | ICD-10-CM | POA: Diagnosis present

## 2018-10-27 DIAGNOSIS — F419 Anxiety disorder, unspecified: Secondary | ICD-10-CM | POA: Diagnosis not present

## 2018-10-27 DIAGNOSIS — G2581 Restless legs syndrome: Secondary | ICD-10-CM | POA: Diagnosis not present

## 2018-10-27 DIAGNOSIS — E785 Hyperlipidemia, unspecified: Secondary | ICD-10-CM | POA: Diagnosis not present

## 2018-10-27 DIAGNOSIS — K219 Gastro-esophageal reflux disease without esophagitis: Secondary | ICD-10-CM | POA: Insufficient documentation

## 2018-10-27 DIAGNOSIS — F329 Major depressive disorder, single episode, unspecified: Secondary | ICD-10-CM | POA: Diagnosis not present

## 2018-10-27 DIAGNOSIS — Z419 Encounter for procedure for purposes other than remedying health state, unspecified: Secondary | ICD-10-CM

## 2018-10-27 DIAGNOSIS — Z7902 Long term (current) use of antithrombotics/antiplatelets: Secondary | ICD-10-CM | POA: Insufficient documentation

## 2018-10-27 DIAGNOSIS — Z96649 Presence of unspecified artificial hip joint: Secondary | ICD-10-CM

## 2018-10-27 DIAGNOSIS — Z6832 Body mass index (BMI) 32.0-32.9, adult: Secondary | ICD-10-CM | POA: Insufficient documentation

## 2018-10-27 DIAGNOSIS — I10 Essential (primary) hypertension: Secondary | ICD-10-CM | POA: Diagnosis not present

## 2018-10-27 DIAGNOSIS — E669 Obesity, unspecified: Secondary | ICD-10-CM | POA: Insufficient documentation

## 2018-10-27 DIAGNOSIS — Z7982 Long term (current) use of aspirin: Secondary | ICD-10-CM | POA: Insufficient documentation

## 2018-10-27 DIAGNOSIS — M1612 Unilateral primary osteoarthritis, left hip: Principal | ICD-10-CM | POA: Insufficient documentation

## 2018-10-27 DIAGNOSIS — Z8673 Personal history of transient ischemic attack (TIA), and cerebral infarction without residual deficits: Secondary | ICD-10-CM | POA: Insufficient documentation

## 2018-10-27 DIAGNOSIS — Z79899 Other long term (current) drug therapy: Secondary | ICD-10-CM | POA: Insufficient documentation

## 2018-10-27 DIAGNOSIS — Z86718 Personal history of other venous thrombosis and embolism: Secondary | ICD-10-CM | POA: Insufficient documentation

## 2018-10-27 DIAGNOSIS — Z96641 Presence of right artificial hip joint: Secondary | ICD-10-CM | POA: Diagnosis not present

## 2018-10-27 DIAGNOSIS — N400 Enlarged prostate without lower urinary tract symptoms: Secondary | ICD-10-CM | POA: Insufficient documentation

## 2018-10-27 HISTORY — PX: TOTAL HIP ARTHROPLASTY: SHX124

## 2018-10-27 LAB — TYPE AND SCREEN
ABO/RH(D): A POS
Antibody Screen: NEGATIVE

## 2018-10-27 SURGERY — ARTHROPLASTY, HIP, TOTAL, ANTERIOR APPROACH
Anesthesia: Spinal | Site: Hip | Laterality: Left

## 2018-10-27 MED ORDER — ONDANSETRON HCL 4 MG PO TABS: 4.0000 mg | ORAL_TABLET | Freq: Four times a day (QID) | ORAL | Status: DC | PRN

## 2018-10-27 MED ORDER — 0.9 % SODIUM CHLORIDE (POUR BTL) OPTIME
TOPICAL | Status: DC | PRN
  Administered 2018-10-27: 13:00:00 1000 mL

## 2018-10-27 MED ORDER — POLYETHYLENE GLYCOL 3350 17 G PO PACK: 17.0000 g | PACK | Freq: Every day | ORAL | Status: DC | PRN

## 2018-10-27 MED ORDER — LACTATED RINGERS IV SOLN
INTRAVENOUS | Status: DC | PRN
  Administered 2018-10-27 (×2): via INTRAVENOUS

## 2018-10-27 MED ORDER — BISACODYL 10 MG RE SUPP: 10.0000 mg | Freq: Every day | RECTAL | Status: DC | PRN

## 2018-10-27 MED ORDER — METHOCARBAMOL 500 MG IVPB - SIMPLE MED
500.0000 mg | Freq: Four times a day (QID) | INTRAVENOUS | Status: DC | PRN
  Filled 2018-10-27: qty 50

## 2018-10-27 MED ORDER — FLEET ENEMA 7-19 GM/118ML RE ENEM: 1.0000 | ENEMA | Freq: Once | RECTAL | Status: DC | PRN

## 2018-10-27 MED ORDER — HYDROXYUREA 500 MG PO CAPS
1000.0000 mg | ORAL_CAPSULE | Freq: Every day | ORAL | Status: DC
  Administered 2018-10-27: 1000 mg via ORAL
  Filled 2018-10-27 (×2): qty 2

## 2018-10-27 MED ORDER — MORPHINE SULFATE (PF) 2 MG/ML IV SOLN: 0.5000 mg | INTRAVENOUS | Status: DC | PRN

## 2018-10-27 MED ORDER — PROPOFOL 10 MG/ML IV BOLUS
INTRAVENOUS | Status: AC
  Filled 2018-10-27: qty 60

## 2018-10-27 MED ORDER — FENTANYL CITRATE (PF) 100 MCG/2ML IJ SOLN
INTRAMUSCULAR | Status: AC
  Filled 2018-10-27: qty 2

## 2018-10-27 MED ORDER — ONDANSETRON HCL 4 MG/2ML IJ SOLN
INTRAMUSCULAR | Status: AC
  Filled 2018-10-27: qty 2

## 2018-10-27 MED ORDER — ROPINIROLE HCL 0.5 MG PO TABS
0.7500 mg | ORAL_TABLET | Freq: Every day | ORAL | Status: DC
  Administered 2018-10-27: 0.75 mg via ORAL
  Filled 2018-10-27: qty 2

## 2018-10-27 MED ORDER — DEXAMETHASONE SODIUM PHOSPHATE 10 MG/ML IJ SOLN
10.0000 mg | Freq: Once | INTRAMUSCULAR | Status: AC
  Administered 2018-10-28: 10 mg via INTRAVENOUS
  Filled 2018-10-27: qty 1

## 2018-10-27 MED ORDER — MENTHOL 3 MG MT LOZG: 1.0000 | LOZENGE | OROMUCOSAL | Status: DC | PRN

## 2018-10-27 MED ORDER — BUPIVACAINE HCL (PF) 0.25 % IJ SOLN
INTRAMUSCULAR | Status: AC
  Filled 2018-10-27: qty 30

## 2018-10-27 MED ORDER — PROPOFOL 500 MG/50ML IV EMUL
INTRAVENOUS | Status: DC | PRN
  Administered 2018-10-27: 50 ug/kg/min via INTRAVENOUS

## 2018-10-27 MED ORDER — TRANEXAMIC ACID-NACL 1000-0.7 MG/100ML-% IV SOLN
INTRAVENOUS | Status: AC
  Filled 2018-10-27: qty 100

## 2018-10-27 MED ORDER — CHLORHEXIDINE GLUCONATE 4 % EX LIQD: 60.0000 mL | Freq: Once | CUTANEOUS | Status: DC

## 2018-10-27 MED ORDER — STERILE WATER FOR IRRIGATION IR SOLN
Status: DC | PRN
  Administered 2018-10-27: 2000 mL

## 2018-10-27 MED ORDER — HYDROCODONE-ACETAMINOPHEN 7.5-325 MG PO TABS: 1.0000 | ORAL_TABLET | ORAL | Status: DC | PRN

## 2018-10-27 MED ORDER — ONDANSETRON HCL 4 MG/2ML IJ SOLN: 4.0000 mg | Freq: Four times a day (QID) | INTRAMUSCULAR | Status: DC | PRN

## 2018-10-27 MED ORDER — MIDAZOLAM HCL 2 MG/2ML IJ SOLN
INTRAMUSCULAR | Status: AC
  Filled 2018-10-27: qty 2

## 2018-10-27 MED ORDER — FLUTICASONE PROPIONATE 50 MCG/ACT NA SUSP
2.0000 | Freq: Every day | NASAL | Status: DC
  Filled 2018-10-27: qty 16

## 2018-10-27 MED ORDER — DEXAMETHASONE SODIUM PHOSPHATE 10 MG/ML IJ SOLN
8.0000 mg | Freq: Once | INTRAMUSCULAR | Status: AC
  Administered 2018-10-27: 14:00:00 8 mg via INTRAVENOUS

## 2018-10-27 MED ORDER — LACTATED RINGERS IV SOLN
INTRAVENOUS | Status: DC
  Administered 2018-10-27: 11:00:00 via INTRAVENOUS

## 2018-10-27 MED ORDER — ONDANSETRON HCL 4 MG/2ML IJ SOLN: 4.0000 mg | Freq: Once | INTRAMUSCULAR | Status: DC | PRN

## 2018-10-27 MED ORDER — TRANEXAMIC ACID-NACL 1000-0.7 MG/100ML-% IV SOLN
1000.0000 mg | INTRAVENOUS | Status: AC
  Administered 2018-10-27: 1000 mg via INTRAVENOUS
  Filled 2018-10-27: qty 100

## 2018-10-27 MED ORDER — SODIUM CHLORIDE 0.9 % IV SOLN
INTRAVENOUS | Status: DC
  Administered 2018-10-27 – 2018-10-28 (×2): via INTRAVENOUS

## 2018-10-27 MED ORDER — METHOCARBAMOL 500 MG PO TABS
500.0000 mg | ORAL_TABLET | Freq: Four times a day (QID) | ORAL | Status: DC | PRN
  Administered 2018-10-27 – 2018-10-28 (×2): 500 mg via ORAL
  Filled 2018-10-27 (×2): qty 1

## 2018-10-27 MED ORDER — SIMVASTATIN 20 MG PO TABS
10.0000 mg | ORAL_TABLET | Freq: Every day | ORAL | Status: DC
  Administered 2018-10-27: 10 mg via ORAL
  Filled 2018-10-27: qty 1

## 2018-10-27 MED ORDER — MIDAZOLAM HCL 5 MG/5ML IJ SOLN
INTRAMUSCULAR | Status: DC | PRN
  Administered 2018-10-27: 2 mg via INTRAVENOUS

## 2018-10-27 MED ORDER — ALFUZOSIN HCL ER 10 MG PO TB24
10.0000 mg | ORAL_TABLET | Freq: Every day | ORAL | Status: DC
  Administered 2018-10-28: 10 mg via ORAL
  Filled 2018-10-27: qty 1

## 2018-10-27 MED ORDER — PANTOPRAZOLE SODIUM 20 MG PO TBEC
20.0000 mg | DELAYED_RELEASE_TABLET | Freq: Every day | ORAL | Status: DC
  Administered 2018-10-28: 20 mg via ORAL
  Filled 2018-10-27: qty 1

## 2018-10-27 MED ORDER — BUPIVACAINE-EPINEPHRINE (PF) 0.25% -1:200000 IJ SOLN
INTRAMUSCULAR | Status: DC | PRN
  Administered 2018-10-27: 25 mL

## 2018-10-27 MED ORDER — ACETAMINOPHEN 10 MG/ML IV SOLN
1000.0000 mg | Freq: Four times a day (QID) | INTRAVENOUS | Status: DC
  Administered 2018-10-27: 13:00:00 1000 mg via INTRAVENOUS
  Filled 2018-10-27: qty 100

## 2018-10-27 MED ORDER — ACETAMINOPHEN 500 MG PO TABS
500.0000 mg | ORAL_TABLET | Freq: Four times a day (QID) | ORAL | Status: DC
  Administered 2018-10-27 – 2018-10-28 (×2): 500 mg via ORAL
  Filled 2018-10-27 (×3): qty 1

## 2018-10-27 MED ORDER — DEXAMETHASONE SODIUM PHOSPHATE 10 MG/ML IJ SOLN
INTRAMUSCULAR | Status: AC
  Filled 2018-10-27: qty 1

## 2018-10-27 MED ORDER — HYDROCODONE-ACETAMINOPHEN 5-325 MG PO TABS
1.0000 | ORAL_TABLET | ORAL | Status: DC | PRN
  Administered 2018-10-27 – 2018-10-28 (×2): 2 via ORAL
  Filled 2018-10-27 (×2): qty 2

## 2018-10-27 MED ORDER — CLOPIDOGREL BISULFATE 75 MG PO TABS
75.0000 mg | ORAL_TABLET | Freq: Every day | ORAL | Status: DC
  Administered 2018-10-28: 75 mg via ORAL
  Filled 2018-10-27: qty 1

## 2018-10-27 MED ORDER — CEFAZOLIN SODIUM-DEXTROSE 2-4 GM/100ML-% IV SOLN
2.0000 g | INTRAVENOUS | Status: AC
  Administered 2018-10-27: 2 g via INTRAVENOUS
  Filled 2018-10-27: qty 100

## 2018-10-27 MED ORDER — ESCITALOPRAM OXALATE 20 MG PO TABS
20.0000 mg | ORAL_TABLET | Freq: Every day | ORAL | Status: DC
  Administered 2018-10-28: 20 mg via ORAL
  Filled 2018-10-27: qty 1

## 2018-10-27 MED ORDER — PHENOL 1.4 % MT LIQD: 1.0000 | OROMUCOSAL | Status: DC | PRN

## 2018-10-27 MED ORDER — METOCLOPRAMIDE HCL 5 MG PO TABS: 5.0000 mg | ORAL_TABLET | Freq: Three times a day (TID) | ORAL | Status: DC | PRN

## 2018-10-27 MED ORDER — AMLODIPINE BESYLATE 5 MG PO TABS: 5.0000 mg | ORAL_TABLET | Freq: Every day | ORAL | Status: DC

## 2018-10-27 MED ORDER — FENTANYL CITRATE (PF) 100 MCG/2ML IJ SOLN
INTRAMUSCULAR | Status: DC | PRN
  Administered 2018-10-27: 100 ug via INTRAVENOUS

## 2018-10-27 MED ORDER — BUPIVACAINE IN DEXTROSE 0.75-8.25 % IT SOLN
INTRATHECAL | Status: DC | PRN
  Administered 2018-10-27: 1.8 mL via INTRATHECAL

## 2018-10-27 MED ORDER — FINASTERIDE 5 MG PO TABS
5.0000 mg | ORAL_TABLET | Freq: Every day | ORAL | Status: DC
  Administered 2018-10-28: 5 mg via ORAL
  Filled 2018-10-27: qty 1

## 2018-10-27 MED ORDER — CEFAZOLIN SODIUM-DEXTROSE 2-4 GM/100ML-% IV SOLN
2.0000 g | Freq: Four times a day (QID) | INTRAVENOUS | Status: AC
  Administered 2018-10-27 – 2018-10-28 (×2): 2 g via INTRAVENOUS
  Filled 2018-10-27 (×2): qty 100

## 2018-10-27 MED ORDER — DOCUSATE SODIUM 100 MG PO CAPS
100.0000 mg | ORAL_CAPSULE | Freq: Two times a day (BID) | ORAL | Status: DC
  Administered 2018-10-27 – 2018-10-28 (×2): 100 mg via ORAL
  Filled 2018-10-27 (×2): qty 1

## 2018-10-27 MED ORDER — METOCLOPRAMIDE HCL 5 MG/ML IJ SOLN: 5.0000 mg | Freq: Three times a day (TID) | INTRAMUSCULAR | Status: DC | PRN

## 2018-10-27 MED ORDER — DIPHENHYDRAMINE HCL 12.5 MG/5ML PO ELIX: 12.5000 mg | ORAL_SOLUTION | ORAL | Status: DC | PRN

## 2018-10-27 MED ORDER — POVIDONE-IODINE 10 % EX SWAB
2.0000 "application " | Freq: Once | CUTANEOUS | Status: AC
  Administered 2018-10-27: 2 via TOPICAL

## 2018-10-27 MED ORDER — ASPIRIN EC 325 MG PO TBEC
325.0000 mg | DELAYED_RELEASE_TABLET | Freq: Every day | ORAL | Status: DC
  Administered 2018-10-28: 325 mg via ORAL
  Filled 2018-10-27: qty 1

## 2018-10-27 MED ORDER — FENTANYL CITRATE (PF) 100 MCG/2ML IJ SOLN: 25.0000 ug | INTRAMUSCULAR | Status: DC | PRN

## 2018-10-27 SURGICAL SUPPLY — 47 items
BAG DECANTER FOR FLEXI CONT (MISCELLANEOUS) ×3 IMPLANT
BAG ZIPLOCK 12X15 (MISCELLANEOUS) ×3 IMPLANT
BLADE SAG 18X100X1.27 (BLADE) ×3 IMPLANT
CLOSURE WOUND 1/2 X4 (GAUZE/BANDAGES/DRESSINGS) ×2
COVER PERINEAL POST (MISCELLANEOUS) ×3 IMPLANT
COVER SURGICAL LIGHT HANDLE (MISCELLANEOUS) ×3 IMPLANT
COVER WAND RF STERILE (DRAPES) IMPLANT
CUP ACET PINNACLE SECTR 50MM (Hips) ×1 IMPLANT
DECANTER SPIKE VIAL GLASS SM (MISCELLANEOUS) ×3 IMPLANT
DRAPE STERI IOBAN 125X83 (DRAPES) ×3 IMPLANT
DRAPE U-SHAPE 47X51 STRL (DRAPES) ×6 IMPLANT
DRSG ADAPTIC 3X8 NADH LF (GAUZE/BANDAGES/DRESSINGS) ×3 IMPLANT
DRSG MEPILEX BORDER 4X4 (GAUZE/BANDAGES/DRESSINGS) ×3 IMPLANT
DRSG MEPILEX BORDER 4X8 (GAUZE/BANDAGES/DRESSINGS) ×3 IMPLANT
DURAPREP 26ML APPLICATOR (WOUND CARE) ×3 IMPLANT
ELECT REM PT RETURN 15FT ADLT (MISCELLANEOUS) ×3 IMPLANT
EVACUATOR 1/8 PVC DRAIN (DRAIN) ×3 IMPLANT
GLOVE BIO SURGEON STRL SZ 6 (GLOVE) IMPLANT
GLOVE BIO SURGEON STRL SZ7 (GLOVE) IMPLANT
GLOVE BIO SURGEON STRL SZ8 (GLOVE) ×3 IMPLANT
GLOVE BIOGEL PI IND STRL 6.5 (GLOVE) IMPLANT
GLOVE BIOGEL PI IND STRL 7.0 (GLOVE) IMPLANT
GLOVE BIOGEL PI IND STRL 8 (GLOVE) ×1 IMPLANT
GLOVE BIOGEL PI INDICATOR 6.5 (GLOVE)
GLOVE BIOGEL PI INDICATOR 7.0 (GLOVE)
GLOVE BIOGEL PI INDICATOR 8 (GLOVE) ×2
GOWN STRL REUS W/TWL LRG LVL3 (GOWN DISPOSABLE) ×3 IMPLANT
GOWN STRL REUS W/TWL XL LVL3 (GOWN DISPOSABLE) IMPLANT
HEAD FEM STD 32X+1 STRL (Hips) ×3 IMPLANT
HOLDER FOLEY CATH W/STRAP (MISCELLANEOUS) ×3 IMPLANT
KIT TURNOVER KIT A (KITS) IMPLANT
LINER MARATHON 32 50 (Hips) ×3 IMPLANT
MANIFOLD NEPTUNE II (INSTRUMENTS) ×3 IMPLANT
PACK ANTERIOR HIP CUSTOM (KITS) ×3 IMPLANT
PINNACLE SECTOR CUP 50MM (Hips) ×3 IMPLANT
STEM FEMORAL SZ8 STD ACTIS (Stem) ×3 IMPLANT
STRIP CLOSURE SKIN 1/2X4 (GAUZE/BANDAGES/DRESSINGS) ×4 IMPLANT
SUT ETHIBOND NAB CT1 #1 30IN (SUTURE) ×3 IMPLANT
SUT MNCRL AB 4-0 PS2 18 (SUTURE) ×3 IMPLANT
SUT STRATAFIX 0 PDS 27 VIOLET (SUTURE) ×3
SUT VIC AB 2-0 CT1 27 (SUTURE) ×4
SUT VIC AB 2-0 CT1 TAPERPNT 27 (SUTURE) ×2 IMPLANT
SUTURE STRATFX 0 PDS 27 VIOLET (SUTURE) ×1 IMPLANT
SYR 20ML LL LF (SYRINGE) ×3 IMPLANT
SYR 50ML LL SCALE MARK (SYRINGE) IMPLANT
TRAY FOLEY MTR SLVR 16FR STAT (SET/KITS/TRAYS/PACK) ×3 IMPLANT
YANKAUER SUCT BULB TIP 10FT TU (MISCELLANEOUS) ×3 IMPLANT

## 2018-10-27 NOTE — Anesthesia Procedure Notes (Signed)
Procedure Name: MAC Date/Time: 10/27/2018 12:36 PM Performed by: Lissa Morales, CRNA Pre-anesthesia Checklist: Emergency Drugs available, Patient identified, Suction available and Patient being monitored Patient Re-evaluated:Patient Re-evaluated prior to induction Oxygen Delivery Method: Simple face mask Placement Confirmation: positive ETCO2

## 2018-10-27 NOTE — Anesthesia Procedure Notes (Signed)
Spinal  Patient location during procedure: OR End time: 10/27/2018 12:41 PM Staffing Resident/CRNA: Lissa Morales, CRNA Performed: anesthesiologist  Preanesthetic Checklist Completed: patient identified, site marked, surgical consent, pre-op evaluation, timeout performed, IV checked, risks and benefits discussed and monitors and equipment checked Spinal Block Patient position: sitting Prep: Betadine Patient monitoring: heart rate, continuous pulse ox and blood pressure Approach: midline Location: L4-5 Injection technique: single-shot Needle Needle type: Pencan  Needle gauge: 24 G Needle length: 9 cm Assessment Sensory level: T6 Additional Notes Expiration date of kit checked and confirmed. Patient tolerated procedure well, without complications.

## 2018-10-27 NOTE — Evaluation (Signed)
Physical Therapy Evaluation Patient Details Name: Bradley Ayers MRN: [id number] DOB: [birth year] Today's Date: 10/27/2018   History of Present Illness  Pt is 73 y.o male s/p Lt THA ant approach with PMH signigicant for HTN, HLD, GERD, DVT, CVA in 2019, [place], and Rt THA in June 2020.    Clinical Impression  Sidhanth Larsh is a 73 y.o. male POD 0 s/p Lt THA anterior approach. Patient reports modified independence with SPC for  mobility at baseline. Patient is now limited by functional impairments (see PT problem list below) and requires min assist for transfers and gait with RW. Patient was able to ambulate ~90 feet with RW and minimal cues for safe walker management. Patient instructed in exercise to facilitate ROM and circulation to manage edema. Patient will benefit from continued skilled PT interventions to address impairments and progress towards PLOF. Acute PT will follow to progress mobility and stair training in preparation for safe discharge home.     Follow Up Recommendations Follow surgeon's recommendation for DC plan and follow-up therapies    Equipment Recommendations  None recommended by PT    Recommendations for Other Services       Precautions / Restrictions Precautions Precautions: Fall;Anterior Hip Restrictions Weight Bearing Restrictions: No      Mobility  Bed Mobility Overal bed mobility: Needs Assistance Bed Mobility: Supine to Sit     Supine to sit: Min assist     General bed mobility comments: assist for Lt LE mobility, pt utilizing bed rails to transfer  Transfers Overall transfer level: Needs assistance Equipment used: Rolling walker (2 wheeled) Transfers: Sit to/from Stand Sit to Stand: From elevated surface;Min assist         General transfer comment: pt unable to initiate from low bed, pt requries cues for hand placement with RW and min assist to initiate power up from elevated EOB  Ambulation/Gait Ambulation/Gait assistance: Min guard Gait  Distance (Feet): 90 Feet Assistive device: Rolling walker (2 wheeled) Gait Pattern/deviations: Step-through pattern;Decreased step length - left;Decreased stance time - left;Decreased stride length;Decreased step length - right Gait velocity: pt required cues at start to slow down for safety   General Gait Details: cues required for safe hand placement on RW and to maintain safe proximety to RW during gait, no overt LOB noted  Stairs            Wheelchair Mobility    Modified Rankin (Stroke Patients Only)       Balance Overall balance assessment: Needs assistance Sitting-balance support: No upper extremity supported;Feet supported Sitting balance-Leahy Scale: Good     Standing balance support: During functional activity;Bilateral upper extremity supported Standing balance-Leahy Scale: Fair Standing balance comment: pt requries support for dynamic activity          Pertinent Vitals/Pain Pain Assessment: No/denies pain    Home Living Family/patient expects to be discharged to:: Private residence Living Arrangements: Spouse/significant other Available Help at Discharge: Family Type of Home: House Home Access: Stairs to enter Entrance Stairs-Rails: Left Entrance Stairs-Number of Steps: 2, pt installed grab bar on Lt side by door Home Layout: Two level;Able to live on main level with bedroom/bathroom;Full bath on main level Home Equipment: Walker - 2 wheels;Cane - single point;Bedside commode      Prior Function Level of Independence: Independent with assistive device(s)         Comments: pt was mobilizing with SPC prior to surgery     Hand Dominance        Extremity/Trunk Assessment  Upper Extremity Assessment Upper Extremity Assessment: Overall WFL for tasks assessed    Lower Extremity Assessment Lower Extremity Assessment: LLE deficits/detail LLE Deficits / Details: pt with good quad activation and good ROM with heel slide    Cervical / Trunk  Assessment Cervical / Trunk Assessment: Normal  Communication   Communication: HOH  Cognition Arousal/Alertness: Awake/alert Behavior During Therapy: WFL for tasks assessed/performed Overall Cognitive Status: Within Functional Limits for tasks assessed          General Comments      Exercises Total Joint Exercises Ankle Circles/Pumps: AROM;Seated;10 reps;Both Quad Sets: AROM;Supine;5 reps;Left Heel Slides: AROM;5 reps;Seated;Left(LE elevated) Long Arc Quad: AROM;5 reps;Seated;Left   Assessment/Plan    PT Assessment Patient needs continued PT services  PT Problem List Decreased strength;Decreased balance;Decreased range of motion;Decreased mobility;Decreased activity tolerance       PT Treatment Interventions DME instruction;Functional mobility training;Balance training;Patient/family education;Gait training;Therapeutic activities;Therapeutic exercise;Stair training;Modalities    PT Goals (Current goals can be found in the Care Plan section)  Acute Rehab PT Goals Patient Stated Goal: to walk without RW or SPC and walk for exercise, get back to golfing PT Goal Formulation: With patient Time For Goal Achievement: 11/03/18 Potential to Achieve Goals: Good    Frequency 7X/week    AM-PAC PT "6 Clicks" Mobility  Outcome Measure Help needed turning from your back to your side while in a flat bed without using bedrails?: A Little Help needed moving from lying on your back to sitting on the side of a flat bed without using bedrails?: A Little Help needed moving to and from a bed to a chair (including a wheelchair)?: A Little Help needed standing up from a chair using your arms (e.g., wheelchair or bedside chair)?: A Little Help needed to walk in hospital room?: A Little Help needed climbing 3-5 steps with a railing? : A Little 6 Click Score: 18    End of Session Equipment Utilized During Treatment: Gait belt Activity Tolerance: Patient tolerated treatment well Patient left:  in chair;with call bell/phone within reach;with chair alarm set Nurse Communication: Mobility status PT Visit Diagnosis: Unsteadiness on feet (R26.81);Difficulty in walking, not elsewhere classified (R26.2);Other abnormalities of gait and mobility (R26.89);Muscle weakness (generalized) (M62.81)    Time: [id number] PT Time Calculation (min) (ACUTE ONLY): 23 min   Charges:   PT Evaluation $PT Eval Low Complexity: 1 Low PT Treatments $Therapeutic Exercise: 8-22 mins       Kipp Brood, PT, DPT, [facility] Physical Therapist with [facility]  10/27/2018 7:04 PM

## 2018-10-27 NOTE — Op Note (Signed)
OPERATIVE REPORT- TOTAL HIP ARTHROPLASTY   PREOPERATIVE DIAGNOSIS: Osteoarthritis of the Left hip.   POSTOPERATIVE DIAGNOSIS: Osteoarthritis of the Left  hip.   PROCEDURE: Left total hip arthroplasty, anterior approach.   SURGEON: Gaynelle Arabian, MD   ASSISTANT: Molli Barrows, PA-C  ANESTHESIA:  Spinal  ESTIMATED BLOOD LOSS:-200 mL    DRAINS: Hemovac x1.   COMPLICATIONS: None   CONDITION: PACU - hemodynamically stable.   BRIEF CLINICAL NOTE: Bradley Ayers is a 73 y.o. male who has advanced end-  stage arthritis of their Left  hip with progressively worsening pain and  dysfunction.The patient has failed nonoperative management and presents for  total hip arthroplasty.   PROCEDURE IN DETAIL: After successful administration of spinal  anesthetic, the traction boots for the [facility] bed were placed on both  feet and the patient was placed onto the [facility] bed, boots placed into the leg  holders. The Left hip was then isolated from the perineum with plastic  drapes and prepped and draped in the usual sterile fashion. ASIS and  greater trochanter were marked and a oblique incision was made, starting  at about 1 cm lateral and 2 cm distal to the ASIS and coursing towards  the anterior cortex of the femur. The skin was cut with a 10 blade  through subcutaneous tissue to the level of the fascia overlying the  tensor fascia lata muscle. The fascia was then incised in line with the  incision at the junction of the anterior third and posterior 2/3rd. The  muscle was teased off the fascia and then the interval between the TFL  and the rectus was developed. The Hohmann retractor was then placed at  the top of the femoral neck over the capsule. The vessels overlying the  capsule were cauterized and the fat on top of the capsule was removed.  A Hohmann retractor was then placed anterior underneath the rectus  femoris to give exposure to the entire anterior capsule. A T-shaped  capsulotomy  was performed. The edges were tagged and the femoral head  was identified.       Osteophytes are removed off the superior acetabulum.  The femoral neck was then cut in situ with an oscillating saw. Traction  was then applied to the left lower extremity utilizing the [facility]  traction. The femoral head was then removed. Retractors were placed  around the acetabulum and then circumferential removal of the labrum was  performed. Osteophytes were also removed. Reaming starts at 47 mm to  medialize and  Increased in 2 mm increments to 49 mm. We reamed in  approximately 40 degrees of abduction, 20 degrees anteversion. A 50 mm  pinnacle acetabular shell was then impacted in anatomic position under  fluoroscopic guidance with excellent purchase. We did not need to place  any additional dome screws. A 32 mm neutral + 4 marathon liner was then  placed into the acetabular shell.       The femoral lift was then placed along the lateral aspect of the femur  just distal to the vastus ridge. The leg was  externally rotated and capsule  was stripped off the inferior aspect of the femoral neck down to the  level of the lesser trochanter, this was done with electrocautery. The femur was lifted after this was performed. The  leg was then placed in an extended and adducted position essentially delivering the femur. We also removed the capsule superiorly and the piriformis from the piriformis fossa  to gain excellent exposure of the  proximal femur. Rongeur was used to remove some cancellous bone to get  into the lateral portion of the proximal femur for placement of the  initial starter reamer. The starter broaches was placed  the starter broach  and was shown to go down the center of the canal. Broaching  with the Actis system was then performed starting at size 0  coursing  Up to size 8. A size 8 had excellent torsional and rotational  and axial stability. The trial standard offset neck was then placed  with a 32  + 1 trial head. The hip was then reduced. We confirmed that  the stem was in the canal both on AP and lateral x-rays. It also has excellent sizing. The hip was reduced with outstanding stability through full extension and full external rotation.. AP pelvis was taken and the leg lengths were measured and found to be equal. Hip was then dislocated again and the femoral head and neck removed. The  femoral broach was removed. Size 8 Actis stem with a standard offset  neck was then impacted into the femur following native anteversion. Has  excellent purchase in the canal. Excellent torsional and rotational and  axial stability. It is confirmed to be in the canal on AP and lateral  fluoroscopic views. The 32 + 1 metal head was placed and the hip  reduced with outstanding stability. Again AP pelvis was taken and it  confirmed that the leg lengths were equal. The wound was then copiously  irrigated with saline solution and the capsule reattached and repaired  with Ethibond suture. 30 ml of .25% Bupivicaine was  injected into the capsule and into the edge of the tensor fascia lata as well as subcutaneous tissue. The fascia overlying the tensor fascia lata was then closed with a running #1 V-Loc. Subcu was closed with interrupted 2-0 Vicryl and subcuticular running 4-0 Monocryl. Incision was cleaned  and dried. Steri-Strips and a bulky sterile dressing applied. Hemovac  drain was hooked to suction and then the patient was awakened and transported to  recovery in stable condition.        Please note that a surgical assistant was a medical necessity for this procedure to perform it in a safe and expeditious manner. Assistant was necessary to provide appropriate retraction of vital neurovascular structures and to prevent femoral fracture and allow for anatomic placement of the prosthesis.  Gaynelle Arabian, M.D.

## 2018-10-27 NOTE — Discharge Instructions (Signed)
°Dr. Frank Aluisio °Total Joint Specialist °[facility] °[address] °[place], [place] [address] °[phone] ° °ANTERIOR APPROACH TOTAL HIP REPLACEMENT POSTOPERATIVE DIRECTIONS ° ° °Hip Rehabilitation, Guidelines Following Surgery  °The results of a hip operation are greatly improved after range of motion and muscle strengthening exercises. Follow all safety measures which are given to protect your hip. If any of these exercises cause increased pain or swelling in your joint, decrease the amount until you are comfortable again. Then slowly increase the exercises. Call your caregiver if you have problems or questions.  ° °HOME CARE INSTRUCTIONS  °• Remove items at home which could result in a fall. This includes throw rugs or furniture in walking pathways.  °· ICE to the affected hip every three hours for 30 minutes at a time and then as needed for pain and swelling.  Continue to use ice on the hip for pain and swelling from surgery. You may notice swelling that will progress down to the foot and ankle.  This is normal after surgery.  Elevate the leg when you are not up walking on it.   °· Continue to use the breathing machine which will help keep your temperature down.  It is common for your temperature to cycle up and down following surgery, especially at night when you are not up moving around and exerting yourself.  The breathing machine keeps your lungs expanded and your temperature down. ° °DIET °You may resume your previous home diet once your are discharged from the hospital. ° °DRESSING / WOUND CARE / SHOWERING °You may shower 3 days after surgery, but keep the wounds dry during showering.  You may use an occlusive plastic wrap (Press'n Seal for example), NO SOAKING/SUBMERGING IN THE BATHTUB.  If the bandage gets wet, change with a clean dry gauze.  If the incision gets wet, pat the wound dry with a clean towel. °You may start showering once you are discharged home but do not submerge the  incision under water. Just pat the incision dry and apply a dry gauze dressing on daily. °Change the surgical dressing daily and reapply a dry dressing each time. ° °ACTIVITY °Walk with your walker as instructed. °Use walker as long as suggested by your caregivers. °Avoid periods of inactivity such as sitting longer than an hour when not asleep. This helps prevent blood clots.  °You may resume a sexual relationship in one month or when given the OK by your doctor.  °You may return to work once you are cleared by your doctor.  °Do not drive a car for 6 weeks or until released by you surgeon.  °Do not drive while taking narcotics. ° °WEIGHT BEARING °Weight bearing as tolerated with assist device (walker, cane, etc) as directed, use it as long as suggested by your surgeon or therapist, typically at least 4-6 weeks. ° °POSTOPERATIVE CONSTIPATION PROTOCOL °Constipation - defined medically as fewer than three stools per week and severe constipation as less than one stool per week. ° °One of the most common issues patients have following surgery is constipation.  Even if you have a regular bowel pattern at home, your normal regimen is likely to be disrupted due to multiple reasons following surgery.  Combination of anesthesia, postoperative narcotics, change in appetite and fluid intake all can affect your bowels.  In order to avoid complications following surgery, here are some recommendations in order to help you during your recovery period. ° °Colace (docusate) - Pick up an over-the-counter form   of Colace or another stool softener and take twice a day as long as you are requiring postoperative pain medications.  Take with a full glass of water daily.  If you experience loose stools or diarrhea, hold the colace until you stool forms back up.  If your symptoms do not get better within 1 week or if they get worse, check with your doctor. ° °Dulcolax (bisacodyl) - Pick up over-the-counter and take as directed by the product  packaging as needed to assist with the movement of your bowels.  Take with a full glass of water.  Use this product as needed if not relieved by Colace only.  ° °MiraLax (polyethylene glycol) - Pick up over-the-counter to have on hand.  MiraLax is a solution that will increase the amount of water in your bowels to assist with bowel movements.  Take as directed and can mix with a glass of water, juice, soda, coffee, or tea.  Take if you go more than two days without a movement. °Do not use MiraLax more than once per day. Call your doctor if you are still constipated or irregular after using this medication for 7 days in a row. ° °If you continue to have problems with postoperative constipation, please contact the office for further assistance and recommendations.  If you experience "the worst abdominal pain ever" or develop nausea or vomiting, please contact the office immediatly for further recommendations for treatment. ° °ITCHING ° If you experience itching with your medications, try taking only a single pain pill, or even half a pain pill at a time.  You can also use Benadryl over the counter for itching or also to help with sleep.  ° °TED HOSE STOCKINGS °Wear the elastic stockings on both legs for three weeks following surgery during the day but you may remove then at night for sleeping. ° °MEDICATIONS °See your medication summary on the “After Visit Summary” that the nursing staff will review with you prior to discharge.  You may have some home medications which will be placed on hold until you complete the course of blood thinner medication.  It is important for you to complete the blood thinner medication as prescribed by your surgeon.  Continue your approved medications as instructed at time of discharge. ° °PRECAUTIONS °If you experience chest pain or shortness of breath - call 911 immediately for transfer to the hospital emergency department.  °If you develop a fever greater that 101 F, purulent drainage  from wound, increased redness or drainage from wound, foul odor from the wound/dressing, or calf pain - CONTACT YOUR SURGEON.   °                                                °FOLLOW-UP APPOINTMENTS °Make sure you keep all of your appointments after your operation with your surgeon and caregivers. You should call the office at the above phone number and make an appointment for approximately two weeks after the date of your surgery or on the date instructed by your surgeon outlined in the "After Visit Summary". ° °RANGE OF MOTION AND STRENGTHENING EXERCISES  °These exercises are designed to help you keep full movement of your hip joint. Follow your caregiver's or physical therapist's instructions. Perform all exercises about fifteen times, three times per day or as directed. Exercise both hips, even if you have   had only one joint replacement. These exercises can be done on a training (exercise) mat, on the floor, on a table or on a bed. Use whatever works the best and is most comfortable for you. Use music or television while you are exercising so that the exercises are a pleasant break in your day. This will make your life better with the exercises acting as a break in routine you can look forward to.  °• Lying on your back, slowly slide your foot toward your buttocks, raising your knee up off the floor. Then slowly slide your foot back down until your leg is straight again.  °• Lying on your back spread your legs as far apart as you can without causing discomfort.  °• Lying on your side, raise your upper leg and foot straight up from the floor as far as is comfortable. Slowly lower the leg and repeat.  °• Lying on your back, tighten up the muscle in the front of your thigh (quadriceps muscles). You can do this by keeping your leg straight and trying to raise your heel off the floor. This helps strengthen the largest muscle supporting your knee.  °• Lying on your back, tighten up the muscles of your buttocks both  with the legs straight and with the knee bent at a comfortable angle while keeping your heel on the floor.  ° °IF YOU ARE TRANSFERRED TO A SKILLED REHAB FACILITY °If the patient is transferred to a skilled rehab facility following release from the hospital, a list of the current medications will be sent to the facility for the patient to continue.  When discharged from the skilled rehab facility, please have the facility set up the patient's Home Health Physical Therapy prior to being released. Also, the skilled facility will be responsible for providing the patient with their medications at time of release from the facility to include their pain medication, the muscle relaxants, and their blood thinner medication. If the patient is still at the rehab facility at time of the two week follow up appointment, the skilled rehab facility will also need to assist the patient in arranging follow up appointment in our office and any transportation needs. ° °MAKE SURE YOU:  °• Understand these instructions.  °• Get help right away if you are not doing well or get worse.  ° ° °Pick up stool softner and laxative for home use following surgery while on pain medications. °Do not submerge incision under water. °Please use good hand washing techniques while changing dressing each day. °May shower starting three days after surgery. °Please use a clean towel to pat the incision dry following showers. °Continue to use ice for pain and swelling after surgery. °Do not use any lotions or creams on the incision until instructed by your surgeon. ° °

## 2018-10-27 NOTE — Transfer of Care (Signed)
Immediate Anesthesia Transfer of Care Note  Patient: [name]  Procedure(s) Performed: TOTAL HIP ARTHROPLASTY ANTERIOR APPROACH (Left Hip)  Patient Location: PACU  Anesthesia Type:Spinal  Level of Consciousness: awake, alert , oriented and patient cooperative  Airway & Oxygen Therapy: Patient Spontanous Breathing and Patient connected to face mask oxygen  Post-op Assessment: Report given to RN and Post -op Vital signs reviewed and stable  Post vital signs: stable  Last Vitals:  Vitals Value Taken Time  BP 104/64 10/27/18 1415  Temp    Pulse 54 10/27/18 1419  Resp 13 10/27/18 1419  SpO2 100 % 10/27/18 1419  Vitals shown include unvalidated device data.  Last Pain:  Vitals:   10/27/18 1116  TempSrc: Oral  PainSc:       Patients Stated Pain Goal: 4 ([id number])  Complications: No apparent anesthesia complications

## 2018-10-27 NOTE — Plan of Care (Signed)
  Problem: Education: Goal: Knowledge of General Education information will improve Description: Including pain rating scale, medication(s)/side effects and non-pharmacologic comfort measures Outcome: Progressing   Problem: Pain Managment: Goal: General experience of comfort will improve Outcome: Progressing   Problem: Education: Goal: Knowledge of the prescribed therapeutic regimen will improve Outcome: Progressing   Problem: Pain Management: Goal: Pain level will decrease with appropriate interventions Outcome: Progressing

## 2018-10-27 NOTE — Interval H&P Note (Signed)
History and Physical Interval Note:  10/27/2018 11:14 AM  [name]  has presented today for surgery, with the diagnosis of left hip osteoarthritis.  The various methods of treatment have been discussed with the patient and family. After consideration of risks, benefits and other options for treatment, the patient has consented to  Procedure(s) with comments: Plain Dealing (Left) - 161min as a surgical intervention.  The patient's history has been reviewed, patient examined, no change in status, stable for surgery.  I have reviewed the patient's chart and labs.  Questions were answered to the patient's satisfaction.     Pilar Plate Cortlyn Cannell

## 2018-10-27 NOTE — Anesthesia Postprocedure Evaluation (Signed)
Anesthesia Post Note  Patient: [name]  Procedure(s) Performed: TOTAL HIP ARTHROPLASTY ANTERIOR APPROACH (Left Hip)     Patient location during evaluation: PACU Anesthesia Type: Spinal Level of consciousness: oriented, awake and alert and awake Pain management: pain level controlled Vital Signs Assessment: post-procedure vital signs reviewed and stable Respiratory status: spontaneous breathing, respiratory function stable, patient connected to nasal cannula oxygen and nonlabored ventilation Cardiovascular status: blood pressure returned to baseline and stable Postop Assessment: no headache, no backache, no apparent nausea or vomiting, patient able to bend at knees and spinal receding Anesthetic complications: no    Last Vitals:  Vitals:   10/27/18 1539 10/27/18 1651  BP: 130/74 121/73  Pulse: 61 69  Resp: 18 16  Temp: (!) 36.4 C 36.4 C  SpO2: 100% 98%    Last Pain:  Vitals:   10/27/18 1539  TempSrc:   PainSc: 0-No pain                 Catalina Gravel

## 2018-10-28 ENCOUNTER — Encounter (HOSPITAL_COMMUNITY): Payer: Self-pay | Admitting: Orthopedic Surgery

## 2018-10-28 DIAGNOSIS — M1612 Unilateral primary osteoarthritis, left hip: Secondary | ICD-10-CM | POA: Diagnosis not present

## 2018-10-28 LAB — CBC
HCT: 36.9 % — ABNORMAL LOW (ref 39.0–52.0)
Hemoglobin: 12.5 g/dL — ABNORMAL LOW (ref 13.0–17.0)
MCH: 40.3 pg — ABNORMAL HIGH (ref 26.0–34.0)
MCHC: 33.9 g/dL (ref 30.0–36.0)
MCV: 119 fL — ABNORMAL HIGH (ref 80.0–100.0)
Platelets: 409 10*3/uL — ABNORMAL HIGH (ref 150–400)
RBC: 3.1 MIL/uL — ABNORMAL LOW (ref 4.22–5.81)
RDW: 13.6 % (ref 11.5–15.5)
WBC: 9.6 10*3/uL (ref 4.0–10.5)
nRBC: 0 % (ref 0.0–0.2)

## 2018-10-28 LAB — BASIC METABOLIC PANEL
Anion gap: 6 (ref 5–15)
BUN: 13 mg/dL (ref 8–23)
CO2: 24 mmol/L (ref 22–32)
Calcium: 8.4 mg/dL — ABNORMAL LOW (ref 8.9–10.3)
Chloride: 105 mmol/L (ref 98–111)
Creatinine, Ser: 0.65 mg/dL (ref 0.61–1.24)
GFR calc Af Amer: >60 mL/min (ref >60)
GFR calc non Af Amer: >60 mL/min (ref >60)
Glucose, Bld: 152 mg/dL — ABNORMAL HIGH (ref 70–99)
Potassium: 4.3 mmol/L (ref 3.5–5.1)
Sodium: 135 mmol/L (ref 135–145)

## 2018-10-28 MED ORDER — METHOCARBAMOL 500 MG PO TABS: 500.0000 mg | ORAL_TABLET | Freq: Four times a day (QID) | ORAL | 0 refills | Status: AC | PRN

## 2018-10-28 MED ORDER — ASPIRIN 325 MG PO TBEC: 325.0000 mg | DELAYED_RELEASE_TABLET | Freq: Two times a day (BID) | ORAL | 0 refills | Status: AC

## 2018-10-28 MED ORDER — HYDROCODONE-ACETAMINOPHEN 7.5-325 MG PO TABS: 1.0000 | ORAL_TABLET | ORAL | 0 refills | Status: AC | PRN

## 2018-10-28 NOTE — Progress Notes (Signed)
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    PT Comments    Pt ambulated in hallway and performed LE exercises.  Will return to practice safe stair technique prior to d/c.    Follow Up Recommendations  Follow surgeon's recommendation for DC plan and follow-up therapies     Equipment Recommendations  None recommended by PT    Recommendations for Other Services       Precautions / Restrictions Precautions Precautions: Fall Restrictions Other Position/Activity Restrictions: WBAT    Mobility  Bed Mobility Overal bed mobility: Needs Assistance Bed Mobility: Supine to Sit     Supine to sit: Min guard;HOB elevated        Transfers Overall transfer level: Needs assistance Equipment used: Rolling walker (2 wheeled) Transfers: Sit to/from Stand Sit to Stand: Min guard         General transfer comment: elevated bed, verbal cues for UE and LE positioning  Ambulation/Gait Ambulation/Gait assistance: Min guard Gait Distance (Feet): 160 Feet Assistive device: Rolling walker (2 wheeled) Gait Pattern/deviations: Step-through pattern;Decreased stance time - left;Decreased stride length;Antalgic     General Gait Details: verbal cues for sequence, RW positioning, step length   Stairs             Wheelchair Mobility    Modified Rankin (Stroke Patients Only)       Balance                                            Cognition Arousal/Alertness: Awake/alert Behavior During Therapy: WFL for tasks assessed/performed Overall Cognitive Status: Within Functional Limits for tasks assessed                                        Exercises Total Joint Exercises Ankle Circles/Pumps: AROM;10  reps;Both Quad Sets: AROM;Supine;Left;10 reps Heel Slides: AAROM;Left;10 reps Hip ABduction/ADduction: AROM;AAROM;Supine;Standing;10 reps;Left Long Arc Quad: AROM;Left;Seated;10 reps Knee Flexion: AROM;Left;10 reps;Standing Marching in Standing: AROM;Left;10 reps;Standing(all standing exercises performed with UE support) Standing Hip Extension: Left;AROM;10 reps;Standing    General Comments        Pertinent Vitals/Pain      Home Living                      Prior Function            PT Goals (current goals can now be found in the care plan section) Progress towards PT goals: Progressing toward goals    Frequency    7X/week      PT Plan Current plan remains appropriate    Co-evaluation              AM-PAC PT "6 Clicks" Mobility   Outcome Measure  Help needed turning from your back to your side while in a flat bed without using bedrails?: A Little Help needed moving from lying on your back to sitting on the side of a flat bed without using bedrails?: A Little Help needed moving to and from a bed to a chair (including a wheelchair)?:  A Little Help needed standing up from a chair using your arms (e.g., wheelchair or bedside chair)?: A Little Help needed to walk in hospital room?: A Little Help needed climbing 3-5 steps with a railing? : A Little 6 Click Score: 18    End of Session Equipment Utilized During Treatment: Gait belt Activity Tolerance: Patient tolerated treatment well Patient left: in chair;with call bell/phone within reach;with chair alarm set Nurse Communication: Mobility status PT Visit Diagnosis: Other abnormalities of gait and mobility (R26.89)     Time: [id number] PT Time Calculation (min) (ACUTE ONLY): 23 min  Charges:  $Gait Training: 8-22 mins $Therapeutic Exercise: 8-22 mins                    Carmelia Bake, PT, DPT Acute Rehabilitation Services Office: [phone] Pager: [place] 10/28/2018,  12:24 PM
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
## 2018-10-28 NOTE — TOC Transition Note (Signed)
Transition of Care [facility]) - CM/SW Discharge Note   Patient Details  Name: Bradley Ayers MRN: [id number] Date of Birth: 1945/09/05  [facility] of [facility]) CM/SW Contact:  Leeroy Cha, RN Phone Number: 10/28/2018, 11:01 AM   Clinical Narrative:    dcd to home with HEP.  Has needed equipment.   Final next level of care: Home/Self Care Barriers to Discharge: No Barriers Identified   Patient Goals and CMS Choice Patient states their goals for this hospitalization and ongoing recovery are:: to go home CMS Medicare.gov Compare Post Acute Care list provided to:: Patient    Discharge Placement                       Discharge Plan and Services                                     Social Determinants of Health (SDOH) Interventions     Readmission Risk Interventions No flowsheet data found.

## 2018-10-28 NOTE — Progress Notes (Signed)
Physical Therapy Treatment Patient Details Name: Bradley Ayers MRN: [id number] DOB: 1945-06-19 Today's Date: 10/28/2018    History of Present Illness Pt is 73 y.o male s/p Lt THA ant approach with PMH signigicant for HTN, HLD, GERD, DVT, CVA in 2019, [place], and Rt THA in June 2020.    PT Comments    Pt ambulated in hallway and practiced safe stair technique.  Pt able to recall sequence from previous THA.  Pt also provided with HEP handout.  Pt reviewed handout and had no further questions.  Pt ready for d/c home today.    Follow Up Recommendations  Follow surgeon's recommendation for DC plan and follow-up therapies     Equipment Recommendations  None recommended by PT    Recommendations for Other Services       Precautions / Restrictions Precautions Precautions: Fall Restrictions Other Position/Activity Restrictions: WBAT    Mobility  Bed Mobility Overal bed mobility: Needs Assistance Bed Mobility: Supine to Sit     Supine to sit: Min guard;HOB elevated     General bed mobility comments: pt up in recliner  Transfers Overall transfer level: Needs assistance Equipment used: Rolling walker (2 wheeled) Transfers: Sit to/from Stand Sit to Stand: Supervision         General transfer comment: verbal cues for UE and LE positioning  Ambulation/Gait Ambulation/Gait assistance: Min guard;Supervision Gait Distance (Feet): 200 Feet Assistive device: Rolling walker (2 wheeled) Gait Pattern/deviations: Step-through pattern;Decreased stance time - left;Decreased stride length;Antalgic     General Gait Details: verbal cues for sequence, RW positioning, step length   Stairs Stairs: Yes Stairs assistance: Min guard Stair Management: Step to pattern;One rail Left Number of Stairs: 3 General stair comments: verbal cues for sequence, safety; pt performed twice and reports understanding   Wheelchair Mobility    Modified Rankin (Stroke Patients Only)       Balance                                            Cognition Arousal/Alertness: Awake/alert Behavior During Therapy: WFL for tasks assessed/performed Overall Cognitive Status: Within Functional Limits for tasks assessed                                        Exercises     General Comments        Pertinent Vitals/Pain Pain Assessment: 0-10 Pain Score: 4  Pain Location: 3 Pain Descriptors / Indicators: Aching;Sore Pain Intervention(s): Repositioned;Monitored during session    Home Living                      Prior Function            PT Goals (current goals can now be found in the care plan section) Progress towards PT goals: Progressing toward goals    Frequency    7X/week      PT Plan Current plan remains appropriate    Co-evaluation              AM-PAC PT "6 Clicks" Mobility   Outcome Measure  Help needed turning from your back to your side while in a flat bed without using bedrails?: A Little Help needed moving from lying on your back to sitting on the side of a flat  bed without using bedrails?: A Little Help needed moving to and from a bed to a chair (including a wheelchair)?: A Little Help needed standing up from a chair using your arms (e.g., wheelchair or bedside chair)?: A Little Help needed to walk in hospital room?: A Little Help needed climbing 3-5 steps with a railing? : A Little 6 Click Score: 18    End of Session Equipment Utilized During Treatment: Gait belt Activity Tolerance: Patient tolerated treatment well Patient left: in chair;with call bell/phone within reach;with chair alarm set Nurse Communication: Mobility status PT Visit Diagnosis: Other abnormalities of gait and mobility (R26.89)     Time: [id number] PT Time Calculation (min) (ACUTE ONLY): 10 min  Charges:  $Gait Training: 8-22 mins                      Carmelia Bake, PT, DPT [place] Office: [phone] Pager:  [place] E 10/28/2018, 3:23 PM

## 2018-10-28 NOTE — Care Management Obs Status (Signed)
[place] NOTIFICATION   Patient Details  Name: Bradley Ayers MRN: [id number] Date of Birth: 1945-10-02   Medicare Observation Status Notification Given:  Yes    Leeroy Cha, RN 10/28/2018, 9:22 AM

## 2018-10-28 NOTE — Plan of Care (Signed)
Pt ready for DC home 

## 2018-10-28 NOTE — Care Management CC44 (Signed)
Condition Code 44 Documentation Completed  Patient Details  Name: Donivan Burau MRN: [id number] Date of Birth: [birth year]   Condition Code 44 given:  Yes Patient signature on Condition Code 44 notice:  Yes Documentation of 2 MD's agreement:  Yes Code 44 added to claim:  Yes    Leeroy Cha, RN 10/28/2018, 9:22 AM
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
## 2020-06-19 IMAGING — DX PORTABLE PELVIS 1-2 VIEWS
1 series · 1 of 1 positions shown · non-contrast
Comparison: None.

CLINICAL DATA: Status post right total hip replacement.

EXAM:
PORTABLE PELVIS 1-2 VIEWS

[pelvis ap]
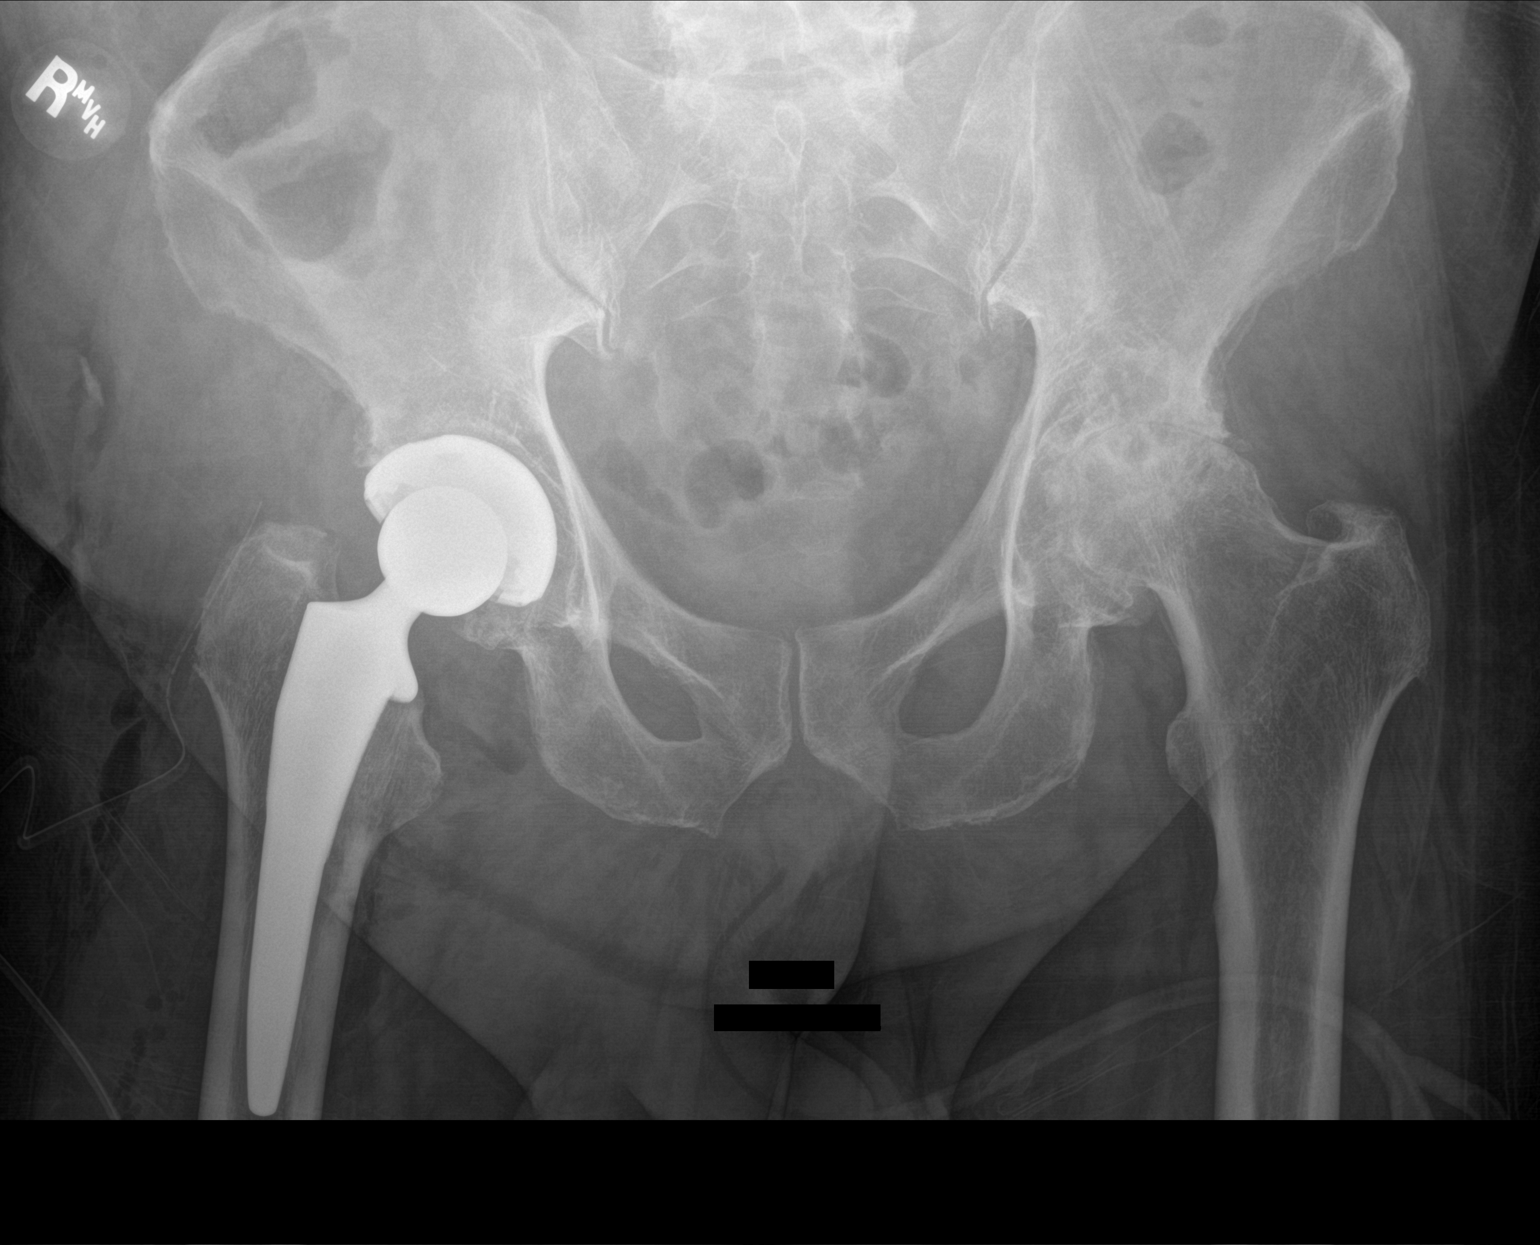

[1 of 1 positions shown; findings below may reference images not displayed]

FINDINGS: Right hip arthroplasty in expected alignment. No periprosthetic
lucency or fracture. Recent postsurgical change includes air and
edema in the soft tissues and joint. A drain is in place. Advanced
left hip osteoarthritis is partially included.
IMPRESSION: Right hip arthroplasty without immediate postoperative complication.

## 2020-06-19 IMAGING — RF OPERATIVE RIGHT HIP WITH PELVIS
1 series · 2 of 2 positions shown · non-contrast
Comparison: None

FLUOROSCOPY TIME:  0 minutes 16.9 seconds

Dose: 2.36 mGy

CLINICAL DATA: RIGHT side anterior approach total hip replacement

EXAM:
OPERATIVE RIGHT HIP (WITH PELVIS IF PERFORMED) 2 VIEWS
TECHNIQUE: Fluoroscopic spot image(s) were submitted for interpretation
post-operatively.

[Series 1: run · 2 of 2 slices shown]
[im 1/2]
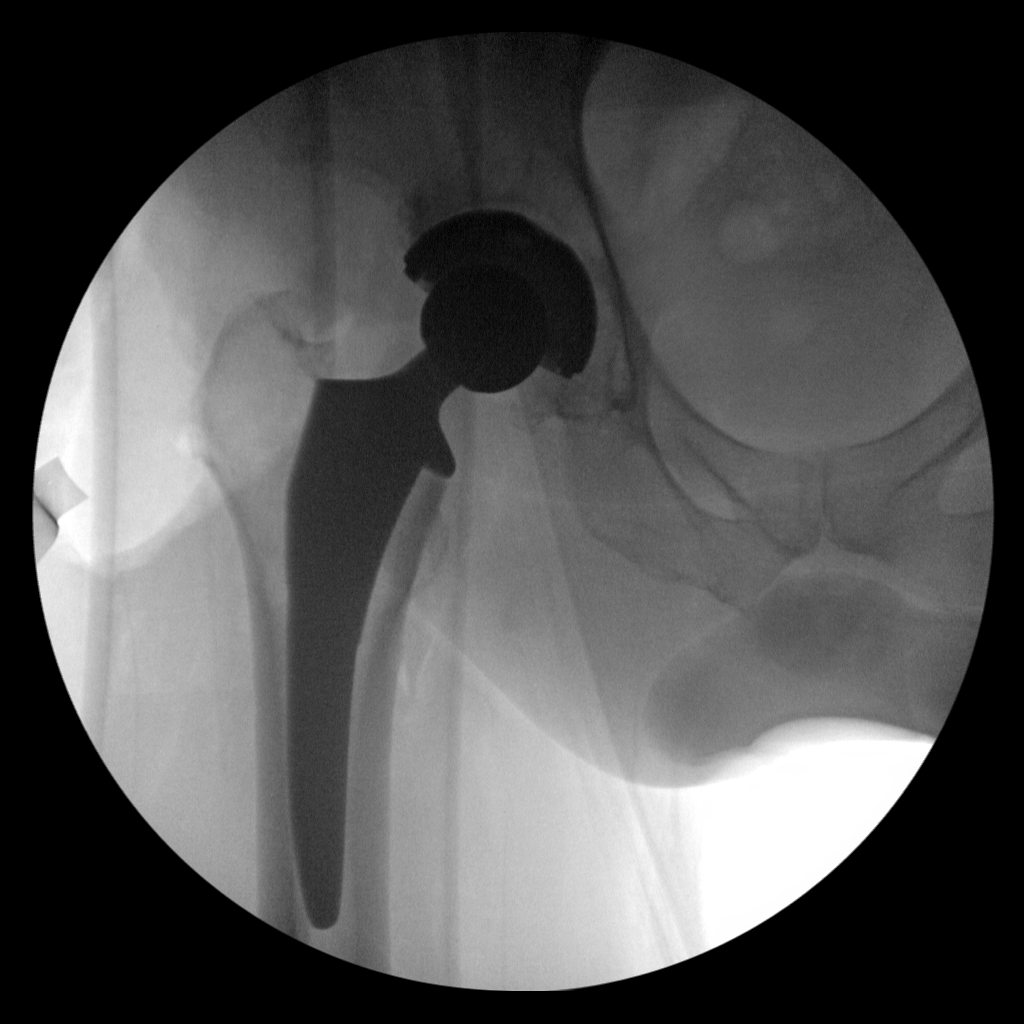
[im 2/2]
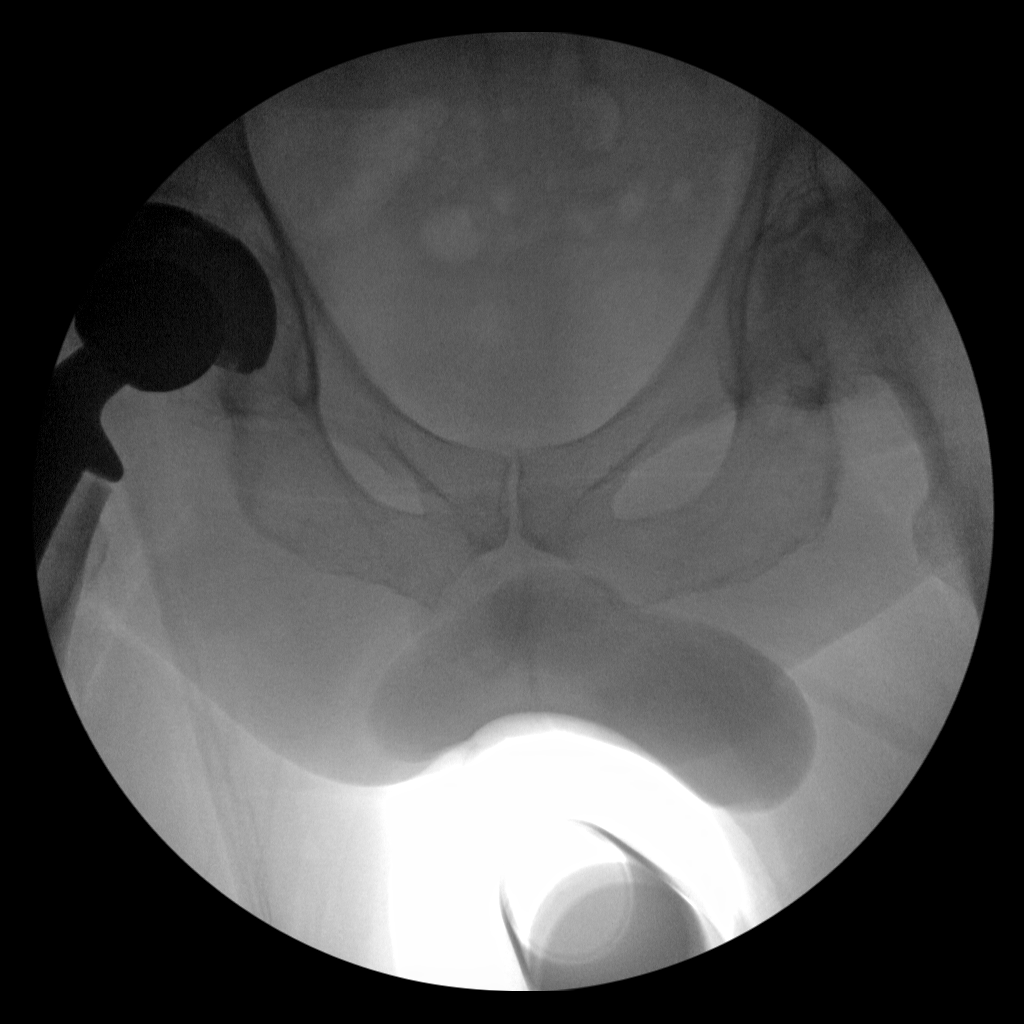

[2 of 2 positions shown; findings below may reference images not displayed]

FINDINGS: Components of a RIGHT hip prosthesis are identified.

No fracture or dislocation.

Degenerative changes of LEFT hip joint are noted.

Bones appear demineralized.
IMPRESSION: RIGHT hip prosthesis without acute complication.

Degenerative changes LEFT hip joint.

## 2020-09-20 IMAGING — DX DG PORTABLE PELVIS
1 series · 1 of 1 positions shown · non-contrast
Comparison: 07/26/2018

CLINICAL DATA: Postop left hip arthroplasty.

EXAM:
PORTABLE PELVIS 1-2 VIEWS

[pelvis ap]
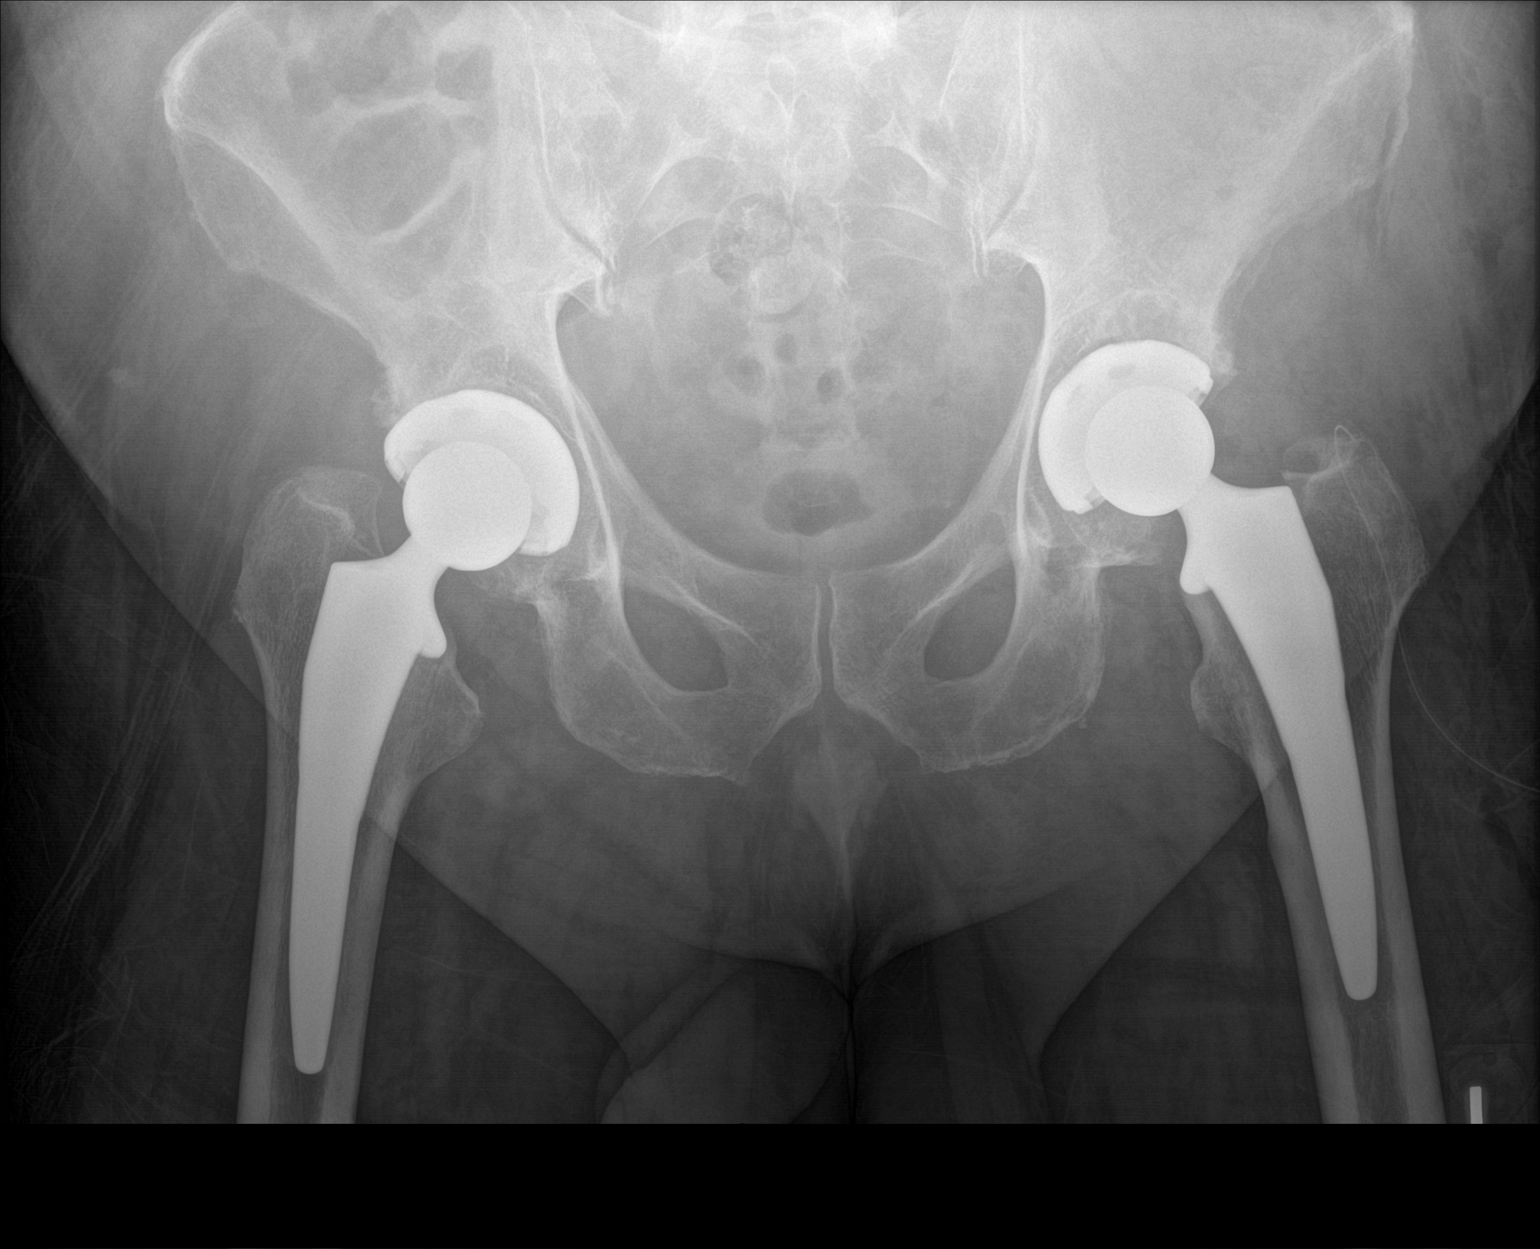

[1 of 1 positions shown; findings below may reference images not displayed]

FINDINGS: Stable appearance of left hip arthroplasty device. No periprosthetic
fracture or subluxation. Percutaneous surgical drainage catheter
overlies the greater trochanter. Previous right hip arthroplasty
device also appears stable.
IMPRESSION: 1. Stable appearance of left hip arthroplasty device. No
complications noted.

## 2022-01-01 ENCOUNTER — Emergency Department (HOSPITAL_BASED_OUTPATIENT_CLINIC_OR_DEPARTMENT_OTHER)
Admission: EM | Admit: 2022-01-01 | Discharge: 2022-01-01 | Disposition: A | Discharge status: Home / Self Care | Payer: Medicare Other | Attending: Emergency Medicine | Admitting: Emergency Medicine

## 2022-01-01 ENCOUNTER — Other Ambulatory Visit: Payer: Self-pay

## 2022-01-01 ENCOUNTER — Emergency Department (HOSPITAL_BASED_OUTPATIENT_CLINIC_OR_DEPARTMENT_OTHER): Payer: Medicare Other

## 2022-01-01 DIAGNOSIS — Z1152 Encounter for screening for COVID-19: Secondary | ICD-10-CM | POA: Insufficient documentation

## 2022-01-01 DIAGNOSIS — Z7982 Long term (current) use of aspirin: Secondary | ICD-10-CM | POA: Diagnosis not present

## 2022-01-01 DIAGNOSIS — R079 Chest pain, unspecified: Secondary | ICD-10-CM | POA: Insufficient documentation

## 2022-01-01 DIAGNOSIS — Z7902 Long term (current) use of antithrombotics/antiplatelets: Secondary | ICD-10-CM | POA: Insufficient documentation

## 2022-01-01 DIAGNOSIS — R0602 Shortness of breath: Secondary | ICD-10-CM | POA: Insufficient documentation

## 2022-01-01 LAB — CBC
HCT: 39.9 % (ref 39.0–52.0)
Hemoglobin: 14.4 g/dL (ref 13.0–17.0)
MCH: 43.5 pg — ABNORMAL HIGH (ref 26.0–34.0)
MCHC: 36.1 g/dL — ABNORMAL HIGH (ref 30.0–36.0)
MCV: 120.5 fL — ABNORMAL HIGH (ref 80.0–100.0)
Platelets: 365 10*3/uL (ref 150–400)
RBC: 3.31 MIL/uL — ABNORMAL LOW (ref 4.22–5.81)
RDW: 12.6 % (ref 11.5–15.5)
WBC: 4.9 10*3/uL (ref 4.0–10.5)
nRBC: 0 % (ref 0.0–0.2)

## 2022-01-01 LAB — RESP PANEL BY RT-PCR (FLU A&B, COVID) ARPGX2
Influenza A by PCR: NEGATIVE
Influenza B by PCR: NEGATIVE
SARS Coronavirus 2 by RT PCR: NEGATIVE

## 2022-01-01 LAB — PROTIME-INR
INR: 1 (ref 0.8–1.2)
Prothrombin Time: 13.1 seconds (ref 11.4–15.2)

## 2022-01-01 LAB — BASIC METABOLIC PANEL
Anion gap: 7 (ref 5–15)
BUN: 11 mg/dL (ref 8–23)
CO2: 28 mmol/L (ref 22–32)
Calcium: 8.7 mg/dL — ABNORMAL LOW (ref 8.9–10.3)
Chloride: 96 mmol/L — ABNORMAL LOW (ref 98–111)
Creatinine, Ser: 0.82 mg/dL (ref 0.61–1.24)
GFR, Estimated: >60 mL/min (ref >60)
Glucose, Bld: 87 mg/dL (ref 70–99)
Potassium: 4.1 mmol/L (ref 3.5–5.1)
Sodium: 131 mmol/L — ABNORMAL LOW (ref 135–145)

## 2022-01-01 LAB — TROPONIN I (HIGH SENSITIVITY): Troponin I (High Sensitivity): 3 ng/L (ref <18)

## 2022-01-01 MED ORDER — IOHEXOL 350 MG/ML SOLN
100.0000 mL | Freq: Once | INTRAVENOUS | Status: AC | PRN
  Administered 2022-01-01: 100 mL via INTRAVENOUS

## 2022-01-01 MED ORDER — BENZONATATE 100 MG PO CAPS: 100.0000 mg | ORAL_CAPSULE | Freq: Three times a day (TID) | ORAL | 0 refills | Status: AC

## 2022-01-01 NOTE — Discharge Instructions (Signed)
Take Tessalon Perles for cough.  As we discussed, your CT scan did not show blood clot but you had old rib fractures  You likely have a respiratory virus  Take Tylenol or Motrin for fever or chills.  See your doctor for follow-up  Return to ER if you have worse shortness of breath or cough or trouble breathing or fever

## 2022-01-01 NOTE — ED Provider Notes (Signed)
[facility] EMERGENCY DEPARTMENT Provider Note   CSN: [id number] Arrival date & time: 01/01/22  1956     History  Chief Complaint  Patient presents with   Chest Pain   Shortness of Breath    Bradley Ayers is a 76 y.o. male here presenting with chest pain and shortness of breath.  Patient [place] the mall and had sudden onset of shortness of breath.  Patient went to urgent care and had chest x-Baity that showed possible pneumonia.  Radiology called later states that there is no pneumonia on chest x-[place]royave at urgent care called patient and recommend that he goes to the ER for further assessment.  Patient went to [facility] and had unremarkable labs and negative troponin and negative x-Dargis.  Patient then came here and left they are without being seen by a provider.  He did not know his results at [facility].  Denies any history of blood clots.  Patient is not on any blood thinners.  The history is provided by the patient.       Home Medications Prior to Admission medications   Medication Sig Start Date End Date Taking? Authorizing Provider  acetaminophen (TYLENOL) 500 MG tablet Take 500 mg by mouth every 6 (six) hours as needed for moderate pain or headache.    [provider]  alfuzosin (UROXATRAL) 10 MG 24 hr tablet Take 10 mg by mouth daily with breakfast.    [provider]  amLODipine (NORVASC) 5 MG tablet Take 5 mg by mouth daily.    [provider]  aspirin EC 325 MG EC tablet Take 1 tablet (325 mg total) by mouth 2 (two) times daily. 10/28/18   Porterfield, Amber, PA-C  Calcium 600-200 MG-UNIT tablet Take 1 tablet by mouth daily.    [provider]  clopidogrel (PLAVIX) 75 MG tablet Take 75 mg by mouth daily.    [provider]  Coenzyme Q10 (COQ10) 100 MG CAPS Take 100 mg by mouth daily.    [provider]  escitalopram (LEXAPRO) 20 MG tablet Take 20 mg by mouth daily.    [provider]   ferrous sulfate 325 (65 FE) MG EC tablet Take 325 mg by mouth once.    [provider]  finasteride (PROSCAR) 5 MG tablet Take 5 mg by mouth daily.    [provider]  fluticasone (FLONASE) 50 MCG/ACT nasal spray Place 2 sprays into both nostrils daily.    [provider]  HYDROcodone-acetaminophen (NORCO) 7.5-325 MG tablet Take 1 tablet by mouth every 4 (four) hours as needed (pain score 7-10). 10/28/18   Porterfield, Amber, PA-C  hydroxyurea (HYDREA) 500 MG capsule Take 1,000 mg by mouth at bedtime. May take with food to minimize GI side effects.     [provider]  methocarbamol (ROBAXIN) 500 MG tablet Take 1 tablet (500 mg total) by mouth every 6 (six) hours as needed for muscle spasms. 10/28/18   Porterfield, Amber, PA-C  pantoprazole (PROTONIX) 20 MG tablet Take 20 mg by mouth daily.    [provider]  rOPINIRole (REQUIP) 0.25 MG tablet Take 0.75 mg by mouth at bedtime.    [provider]  sildenafil (VIAGRA) 100 MG tablet Take 100 mg by mouth daily as needed for erectile dysfunction.    [provider]  simvastatin (ZOCOR) 10 MG tablet Take 10 mg by mouth at bedtime.    [provider]      Allergies  Bee venom    Review of Systems   Review of Systems  Respiratory:  Positive for shortness of breath.   Cardiovascular:  Positive for chest pain.  All other systems reviewed and are negative.   Physical Exam Updated Vital Signs BP (!) 161/96   Pulse 67   Resp 17   Ht '5\' 10"'$  (1.778 m)   Wt 102.1 kg   SpO2 100%   BMI 32.28 kg/m  Physical Exam Vitals and nursing note reviewed.  Constitutional:      Comments: Slightly tachypneic  HENT:     Head: Normocephalic.  Eyes:     Extraocular Movements: Extraocular movements intact.     Pupils: Pupils are equal, round, and reactive to light.  Cardiovascular:     Rate and Rhythm: Normal rate and regular rhythm.     Heart sounds: Normal heart sounds.  Pulmonary:      Comments: Slightly tachypneic and diminished bilateral bases Abdominal:     General: Bowel sounds are normal.     Palpations: Abdomen is soft.  Musculoskeletal:        General: Normal range of motion.     Cervical back: Normal range of motion and neck supple.  Skin:    General: Skin is warm.     Capillary Refill: Capillary refill takes less than 2 seconds.  Neurological:     General: No focal deficit present.     Mental Status: He is oriented to person, place, and time.  Psychiatric:        Mood and Affect: Mood normal.        Behavior: Behavior normal.     ED Results / Procedures / Treatments   Labs (all labs ordered are listed, but only abnormal results are displayed) Labs Reviewed  BASIC METABOLIC PANEL - Abnormal; Notable for the following components:      Result Value   Sodium 131 (*)    Chloride 96 (*)    Calcium 8.7 (*)    All other components within normal limits  CBC - Abnormal; Notable for the following components:   RBC 3.31 (*)    MCV 120.5 (*)    MCH 43.5 (*)    MCHC 36.1 (*)    All other components within normal limits  RESP PANEL BY RT-PCR (FLU A&B, COVID) [id number]  PROTIME-INR  TROPONIN I (HIGH SENSITIVITY)    EKG EKG Interpretation  Date/Time:  Wednesday January 01 2022 20:05:26 EST Ventricular Rate:  67 PR Interval:  202 QRS Duration: 88 QT Interval:  379 QTC Calculation: 400 R Axis:   38 Text Interpretation: Sinus rhythm No previous ECGs available Confirmed by Wandra Arthurs [id number]) on 01/01/2022 8:12:53 PM  Radiology DG Chest 2 View  Result Date: 01/01/2022 CLINICAL DATA:  Chest pain and shortness of breath EXAM: CHEST - 2 VIEW COMPARISON:  Radiographs 09/28/2017 FINDINGS: Mild cardiomegaly. Fat pad at the cardiac apex. Aortic atherosclerotic calcification. No focal consolidation, pleural effusion, or pneumothorax. No acute osseous abnormality. IMPRESSION: No active cardiopulmonary disease.  Mild cardiomegaly Electronically Signed   By:  Placido Sou M.D.   On: 01/01/2022 20:23    Procedures Procedures    Medications Ordered in ED Medications - No data to display  ED Course/ Medical Decision Making/ A&P                           Medical Decision Making Bradley Ayers is a 76 y.o. male here presenting with  shortness of breath.  Patient had questionable pneumonia on chest x-Takemoto in urgent care.  I reviewed his x-Kops here and did not show any pneumonia.  Consider COVID versus flu versus PE.  I have low suspicion for ACS and he already had 1 similar negative troponin at [facility].  Will get second troponin.  Will also get a CTA chest to rule out PE.   10:33 PM Reviewed patient's labs and independently interpreted imaging studies.  Patient's COVID and flu test are negative.  Troponin negative.  CT showed previous rib fractures but no PE.  Patient likely has viral respiratory infection.  At this point, I recommend cough medicine as needed and rest.  Stable for discharge.   Problems Addressed: Shortness of breath: acute illness or injury  Amount and/or Complexity of Data Reviewed Labs: ordered. Decision-making details documented in ED Course. Radiology: ordered and independent interpretation performed. Decision-making details documented in ED Course.  Risk Prescription drug management.    Final Clinical Impression(s) / ED Diagnoses Final diagnoses:  None    Rx / DC Orders ED Discharge Orders     None         Drenda Freeze, MD 01/01/22 2234

## 2022-01-01 NOTE — ED Triage Notes (Signed)
Pt complains of left sided chest pain with SOB for past couple of days.  Was seen at [facility] today and told to come to ER for further evaluation for possible pneumonia. Pt states pain has been on and off and is worse with movement. Cough x several weeks. Denies fever.
